# Patient Record
Sex: Female | Born: 1980 | Race: White | Hispanic: No | Marital: Married | Smoking: Current every day smoker
Health system: Southern US, Community
[De-identification: ages and names within clinical notes are randomized; demographics above are authoritative.]

## PROBLEM LIST (undated history)

## (undated) DIAGNOSIS — G8929 Other chronic pain: Secondary | ICD-10-CM

## (undated) DIAGNOSIS — F909 Attention-deficit hyperactivity disorder, unspecified type: Secondary | ICD-10-CM

## (undated) DIAGNOSIS — Z973 Presence of spectacles and contact lenses: Secondary | ICD-10-CM

## (undated) DIAGNOSIS — M7712 Lateral epicondylitis, left elbow: Secondary | ICD-10-CM

## (undated) DIAGNOSIS — F419 Anxiety disorder, unspecified: Secondary | ICD-10-CM

## (undated) DIAGNOSIS — L4050 Arthropathic psoriasis, unspecified: Secondary | ICD-10-CM

## (undated) DIAGNOSIS — R112 Nausea with vomiting, unspecified: Secondary | ICD-10-CM

## (undated) DIAGNOSIS — Z9889 Other specified postprocedural states: Secondary | ICD-10-CM

## (undated) DIAGNOSIS — L0291 Cutaneous abscess, unspecified: Secondary | ICD-10-CM

## (undated) DIAGNOSIS — M549 Dorsalgia, unspecified: Secondary | ICD-10-CM

## (undated) HISTORY — DX: Anxiety disorder, unspecified: F41.9

## (undated) HISTORY — PX: WISDOM TOOTH EXTRACTION: SHX21

## (undated) HISTORY — PX: TONSILLECTOMY AND ADENOIDECTOMY: SHX28

---

## 2000-04-06 ENCOUNTER — Emergency Department (HOSPITAL_COMMUNITY): Admission: EM | Admit: 2000-04-06 | Discharge: 2000-04-06 | Payer: Self-pay | Admitting: *Deleted

## 2000-05-15 ENCOUNTER — Encounter: Payer: Self-pay | Admitting: Emergency Medicine

## 2000-05-15 ENCOUNTER — Emergency Department (HOSPITAL_COMMUNITY): Admission: EM | Admit: 2000-05-15 | Discharge: 2000-05-15 | Payer: Self-pay | Admitting: Emergency Medicine

## 2001-10-01 ENCOUNTER — Inpatient Hospital Stay (HOSPITAL_COMMUNITY): Admission: AD | Admit: 2001-10-01 | Discharge: 2001-10-01 | Payer: Self-pay | Admitting: *Deleted

## 2001-12-29 ENCOUNTER — Emergency Department (HOSPITAL_COMMUNITY): Admission: EM | Admit: 2001-12-29 | Discharge: 2001-12-29 | Payer: Self-pay

## 2002-04-28 ENCOUNTER — Emergency Department (HOSPITAL_COMMUNITY): Admission: EM | Admit: 2002-04-28 | Discharge: 2002-04-28 | Payer: Self-pay | Admitting: *Deleted

## 2002-04-30 ENCOUNTER — Emergency Department (HOSPITAL_COMMUNITY): Admission: EM | Admit: 2002-04-30 | Discharge: 2002-04-30 | Payer: Self-pay | Admitting: Emergency Medicine

## 2002-05-02 ENCOUNTER — Emergency Department (HOSPITAL_COMMUNITY): Admission: EM | Admit: 2002-05-02 | Discharge: 2002-05-02 | Payer: Self-pay | Admitting: Emergency Medicine

## 2002-09-02 ENCOUNTER — Emergency Department (HOSPITAL_COMMUNITY): Admission: EM | Admit: 2002-09-02 | Discharge: 2002-09-02 | Payer: Self-pay | Admitting: Emergency Medicine

## 2002-09-04 ENCOUNTER — Emergency Department (HOSPITAL_COMMUNITY): Admission: EM | Admit: 2002-09-04 | Discharge: 2002-09-04 | Payer: Self-pay | Admitting: Emergency Medicine

## 2002-11-17 ENCOUNTER — Other Ambulatory Visit: Admission: RE | Admit: 2002-11-17 | Discharge: 2002-11-17 | Payer: Self-pay | Admitting: Obstetrics and Gynecology

## 2003-01-30 ENCOUNTER — Emergency Department (HOSPITAL_COMMUNITY): Admission: EM | Admit: 2003-01-30 | Discharge: 2003-01-30 | Payer: Self-pay | Admitting: Emergency Medicine

## 2003-02-28 ENCOUNTER — Other Ambulatory Visit: Admission: RE | Admit: 2003-02-28 | Discharge: 2003-02-28 | Payer: Self-pay | Admitting: Obstetrics and Gynecology

## 2003-06-24 ENCOUNTER — Emergency Department (HOSPITAL_COMMUNITY): Admission: EM | Admit: 2003-06-24 | Discharge: 2003-06-24 | Payer: Self-pay | Admitting: Emergency Medicine

## 2003-07-02 ENCOUNTER — Ambulatory Visit (HOSPITAL_COMMUNITY): Admission: RE | Admit: 2003-07-02 | Discharge: 2003-07-02 | Payer: Self-pay | Admitting: Family Medicine

## 2003-09-18 ENCOUNTER — Other Ambulatory Visit: Admission: RE | Admit: 2003-09-18 | Discharge: 2003-09-18 | Payer: Self-pay | Admitting: Obstetrics and Gynecology

## 2004-01-16 ENCOUNTER — Other Ambulatory Visit: Admission: RE | Admit: 2004-01-16 | Discharge: 2004-01-16 | Payer: Self-pay | Admitting: Obstetrics and Gynecology

## 2006-07-31 ENCOUNTER — Emergency Department (HOSPITAL_COMMUNITY): Admission: EM | Admit: 2006-07-31 | Discharge: 2006-07-31 | Payer: Self-pay | Admitting: Emergency Medicine

## 2006-08-20 ENCOUNTER — Emergency Department (HOSPITAL_COMMUNITY): Admission: EM | Admit: 2006-08-20 | Discharge: 2006-08-20 | Payer: Self-pay | Admitting: Emergency Medicine

## 2006-08-26 ENCOUNTER — Ambulatory Visit: Payer: Self-pay | Admitting: Family Medicine

## 2007-01-29 ENCOUNTER — Observation Stay (HOSPITAL_COMMUNITY): Admission: EM | Admit: 2007-01-29 | Discharge: 2007-01-31 | Payer: Self-pay | Admitting: Emergency Medicine

## 2007-07-18 ENCOUNTER — Emergency Department (HOSPITAL_COMMUNITY): Admission: EM | Admit: 2007-07-18 | Discharge: 2007-07-18 | Payer: Self-pay | Admitting: Emergency Medicine

## 2007-07-25 ENCOUNTER — Emergency Department (HOSPITAL_COMMUNITY): Admission: EM | Admit: 2007-07-25 | Discharge: 2007-07-25 | Payer: Self-pay | Admitting: Emergency Medicine

## 2007-09-08 ENCOUNTER — Emergency Department (HOSPITAL_COMMUNITY): Admission: EM | Admit: 2007-09-08 | Discharge: 2007-09-08 | Payer: Self-pay | Admitting: Emergency Medicine

## 2009-03-03 ENCOUNTER — Emergency Department (HOSPITAL_COMMUNITY): Admission: EM | Admit: 2009-03-03 | Discharge: 2009-03-03 | Payer: Self-pay | Admitting: Emergency Medicine

## 2009-03-05 ENCOUNTER — Emergency Department (HOSPITAL_COMMUNITY): Admission: EM | Admit: 2009-03-05 | Discharge: 2009-03-05 | Payer: Self-pay | Admitting: Emergency Medicine

## 2009-10-12 ENCOUNTER — Emergency Department (HOSPITAL_COMMUNITY): Admission: EM | Admit: 2009-10-12 | Discharge: 2009-10-12 | Payer: Self-pay | Admitting: Family Medicine

## 2009-10-12 ENCOUNTER — Emergency Department (HOSPITAL_COMMUNITY): Admission: EM | Admit: 2009-10-12 | Discharge: 2009-10-12 | Payer: Self-pay | Admitting: Emergency Medicine

## 2010-03-16 DIAGNOSIS — Z8614 Personal history of Methicillin resistant Staphylococcus aureus infection: Secondary | ICD-10-CM

## 2010-03-16 HISTORY — DX: Personal history of Methicillin resistant Staphylococcus aureus infection: Z86.14

## 2010-04-25 ENCOUNTER — Emergency Department (HOSPITAL_COMMUNITY)
Admission: EM | Admit: 2010-04-25 | Discharge: 2010-04-25 | Disposition: A | Discharge status: Home / Self Care | Payer: Self-pay | Attending: Emergency Medicine | Admitting: Emergency Medicine

## 2010-04-25 DIAGNOSIS — F3289 Other specified depressive episodes: Secondary | ICD-10-CM | POA: Insufficient documentation

## 2010-04-25 DIAGNOSIS — L03119 Cellulitis of unspecified part of limb: Secondary | ICD-10-CM | POA: Insufficient documentation

## 2010-04-25 DIAGNOSIS — M79609 Pain in unspecified limb: Secondary | ICD-10-CM | POA: Insufficient documentation

## 2010-04-25 DIAGNOSIS — F329 Major depressive disorder, single episode, unspecified: Secondary | ICD-10-CM | POA: Insufficient documentation

## 2010-04-25 DIAGNOSIS — L02619 Cutaneous abscess of unspecified foot: Secondary | ICD-10-CM | POA: Insufficient documentation

## 2010-04-28 ENCOUNTER — Emergency Department (HOSPITAL_COMMUNITY)
Admission: EM | Admit: 2010-04-28 | Discharge: 2010-04-28 | Disposition: A | Discharge status: Home / Self Care | Payer: Self-pay | Attending: Emergency Medicine | Admitting: Emergency Medicine

## 2010-04-28 ENCOUNTER — Emergency Department (HOSPITAL_COMMUNITY): Payer: Self-pay

## 2010-04-28 DIAGNOSIS — F17200 Nicotine dependence, unspecified, uncomplicated: Secondary | ICD-10-CM | POA: Insufficient documentation

## 2010-04-28 DIAGNOSIS — L03119 Cellulitis of unspecified part of limb: Secondary | ICD-10-CM | POA: Insufficient documentation

## 2010-04-28 DIAGNOSIS — M79609 Pain in unspecified limb: Secondary | ICD-10-CM | POA: Insufficient documentation

## 2010-04-28 DIAGNOSIS — L02619 Cutaneous abscess of unspecified foot: Secondary | ICD-10-CM | POA: Insufficient documentation

## 2010-07-29 NOTE — Discharge Summary (Signed)
NAMESAMAI, COREA                ACCOUNT NO.:  [id number]   MEDICAL RECORD NO.:  [id number]          PATIENT TYPE:  OBV   LOCATION:  5710                         FACILITY:  [facility]   PHYSICIAN:  Michiel Cowboy, MDDATE OF BIRTH:  1981-02-28   DATE OF ADMISSION:  01/29/2007  DATE OF DISCHARGE:  01/31/2007                               DISCHARGE SUMMARY   DISCHARGE DIAGNOSES:  1. Likely viral syndrome.  2. Severe arthralgia, requiring IV pain medication.  3. Dehydration.  4. History of anxiety.   STUDIES:  Chest x-ray done on the 15th of November, 2008, showing no  acute cardiopulmonary disease.  Films of ankle and hand showing no acute  arthritis or changes.  No fractures.   BRIEF HISTORY:  Please see full H&P for complete history.  But shortly,  this is a 30 year old female with past medical history significant for  anxiety.  Patient presented with diffuse arthralgias.   Patient was feeling well until a couple of days prior to admission when  she developed URI type of symptoms with scratchy throat and a slight  cough that proceeded to have generalized muscle aches and joint pain.  This started with digit of her right hand, then progressed to wrists  (right worse than left), knees (right worse than left), hips and left  jaw.  Pain became intolerable and patient was unable to ambulate.  Patient presented to the emergency department, where she was given  Percocet with no improvement in her pain.  She was admitted for pain  management by [facility].   HOSPITAL COURSE:  1. Arthralgias.  Patient has had a very extensive workup for this,      which was basically negative.  This workup included white blood      cell count of 5.7, normal creatinine of 0.6, LFTs within normal      limits, LDH 144, sed rate was slightly elevated at 44.  Her ANA was      negative.  Rheumatoid factor was negative.  Her TSH was within      normal limits.  CTA was within normal limits.  B12,  folate were all      within normal limits.  HIV was negative.  Mono spot negative.      Influenza negative.  UA clean.  Chest x-ray had no infiltrate.      Gonorrhea PCR was negative.  Patient initially had a slightly      elevated glucose at 153, but this was after receiving a shot of IM      steroids while in the emergency department.  Her hemoglobin      initially was low, 5.6, and her glucose had normalized.  Patient      was at first unable to tolerate only p.o. pain medication;      requested IV morphine at night.  Patient did ambulate to go outside      smoking, but other than that thought that she had severe pain.  On      the day of discharge, patient is feeling well on tramadol and  ibuprofen, but does state that her throat still feels somewhat      scratchy although her arthralgias have improved.  Will discharge      patient on tramadol 25 mg p.o. q.6h. p.r.n. and ibuprofen.  Will      also give patient a prescription for Z-Pak, to be taken only if her      symptoms worsen but not to be taken otherwise.  Patient to have      very close followup with Dr. Azucena Cecil, and I told her to schedule an      appointment prior to leaving the hospital.  2. Hypokalemia.  Prior to discharge, it was noted that patient's      potassium slightly was down to 3.4.  Prior to discharge, I ordered      4 mEq of K-Dur.  This can be rechecked when she presents to her      primary care [facility].  3. Anxiety.  Continue the Wellbutrin.  4. Itching.  It was noted that patient developed itching after      Percocet.  She has been taking Zyrtec in the past, which we      restarted.  We stopped her Percocet instead to the ultram though      itch has resolved.   DISCHARGE MEDICATIONS:  1. Z-Pak.  2. Wellbutrin 150 mg p.o. b.i.d.  3. Zyrtec 10 mg p.o. daily.  4. Tramadol 25 mg p.o. q.6h. p.r.n. pain #20 tabs given.  5. Ibuprofen 600 p.o. q.8h. p.r.n. pain.  Patient not to take it for      longer than 1  week.   FOLLOWUP:  Follow up with Dr. Azucena Cecil.  Patient also to return and seek  medical attention if she develops chest pain, shortness of breath,  fevers, chills or severe pain.      Michiel Cowboy, MD  Electronically Signed     AVD/MEDQ  D:  01/31/2007  T:  01/31/2007  Job:  [id number]   cc:   Tally Joe, M.D.

## 2010-07-29 NOTE — H&P (Signed)
Breanna Garcia, Breanna Garcia                ACCOUNT NO.:  [id number]   MEDICAL RECORD NO.:  [id number]          PATIENT TYPE:  INP   LOCATION:  5710                         FACILITY:  [facility]   PHYSICIAN:  Michiel Cowboy, MDDATE OF BIRTH:  29-Mar-1980   DATE OF ADMISSION:  01/29/2007  DATE OF DISCHARGE:                              HISTORY & PHYSICAL   PRIMARY CARE PHYSICIAN:  Dr. Azucena Cecil with [facility] [place]   CHIEF COMPLAINT:  Pain in joints.   PRESENT ILLNESS:  This is a 30 year old female with past medical history  significant for anxiety only .  Patient initially started to have some  URI type of symptoms about five days ago with cough, sore throat which  proceeded to generalize muscle ache as well as joint pain.  The joints  particularly started with swollen proximal phalangeus of right hand,  second finger and then proceeded to diffuse pain on wrists, right worse  than left, shoulder, right worse than left, hips bilaterally, ankles  bilaterally, knees, right worse than left as well.  Patient  progressively stated that she became immobile secondary to severe pain,  unable to move her hands or legs stating that it hurts too much.  At  this point, her cough has improved, but her pain became very severe and  patient decided to come to emergency department.  In emergency  department, films of all four extremities were obtained showing no  arthritic changes.  She was given Dilaudid 2 mg, dexamethasone 10 mg IM  and Percocet.  Her pain responded to Dilaudid.  Of note, past medical  history only significant for anxiety.   FAMILY:  There is no history of arthritides, but there is history of  colon cancer, hypertension, diabetes in her family.   SOCIAL HISTORY:  Patient is a current smoker, but denies alcohol or drug  abuse, lives at home by herself, has a dog, but states she has no  exposure to woods and never traveled outside of [place].   DRUG ALLERGIES:  PATIENT  STATES THAT VICODIN AND PENICILLIN GIVE A RASH.   MEDICATIONS:  Wellbutrin 150 mg p.o. twice a day.   REVIEW OF SYSTEMS:  Negative for fevers, positive for chills, no chest  pain, no shortness of breath, positive for cough and URI-type symptoms,  no urinary dysfunction.   PHYSICAL EXAMINATION:  VITAL SIGNS:  Temperature 99.1, pulse 100 to 147,  respirations 20, blood pressure 139/99, sating 99% room air.  GENERAL:  Patient appears to be in pain, but somewhat groggy after pain  medication.  HEENT:  Nontraumatic.  No lymphadenopathy noted.  NECK:  Supple.  HEART:  Regular rate and rhythm.  No murmurs, rubs or gallops.  LUNGS:  Minimal crackles at the bases.  ABDOMEN:  Nontender, nondistended.  EXTREMITIES:  Significant for very slight effusion on the right knee and  slight bogginess over proximal joints of phalangeus, no swelling or  redness over joint diffusely noted.  NEUROLOGIC:  Nonfocal.   LABS:  White blood cell count 9.9, H&H 13.1/38.7, platelets 423.  Chemistries:  Sodium 135, potassium 3.8,  creatinine 0.7, albumin 3.6.  LFTs within normal limits.   EKG within normal limits.   X-rays of ankles and wrists appear to be generally without any acute  findings.   ASSESSMENT:  This is a 30 year old female with acute joint pain diffuse  of unclear etiology.  Differential diagnosis is broad, but includes part  of a viral illness.  Sudden onset of the symptoms is not consistent with  chronic arthritides such as lupus or rheumatoid arthritis.  Other  possibilities include a bacterial arthritis such as caused by gonorrhea,  but less likely given diffuse joint involvement.   PLAN:  1. Arthritides.  Will obtain some screening labs including sed rate,      ANA, rheumatoid factor, TSH, folate, B12, CK and LDH.  Will also      check for HIV status.  Will obtain urine gonorrhea PCR.  Will treat      patient's pain with Percocet, as she tolerated this medication      today, as well as  with morphine as needed for breakthrough pain.  2. Prophylaxis.  Will put on Protonix and Lovenox for prophylaxis.  3. Dehydration.  Will re-hydrate patient with some normal saline      overnight.  If no rapid improvement noted, will consult      rheumatology for help with further diagnostic studies.  Otherwise,      expect discharge to home with close followup.  4. Given upper respiratory infection symptoms, will obtain influenza      swab and monospot.      Michiel Cowboy, MD  Electronically Signed     AVD/MEDQ  D:  01/29/2007  T:  01/29/2007  Job:  [id number]

## 2010-08-06 ENCOUNTER — Inpatient Hospital Stay (HOSPITAL_COMMUNITY)
Admission: RE | Admit: 2010-08-06 | Discharge: 2010-08-06 | Disposition: A | Discharge status: Home / Self Care | Payer: Self-pay | Source: Ambulatory Visit

## 2010-12-23 LAB — CK: Total CK: 37

## 2010-12-23 LAB — BASIC METABOLIC PANEL
Calcium: 8.4
Chloride: 107
Creatinine, Ser: 0.58
GFR calc Af Amer: >60
Sodium: 137

## 2010-12-23 LAB — URINALYSIS, ROUTINE W REFLEX MICROSCOPIC
Nitrite: NEGATIVE
Specific Gravity, Urine: 1.008
Urobilinogen, UA: 1

## 2010-12-23 LAB — TSH: TSH: 0.429

## 2010-12-23 LAB — PROTEIN / CREATININE RATIO, URINE: Creatinine, Urine: 25.1

## 2010-12-23 LAB — CBC
HCT: 36.5
Hemoglobin: 11.6 — ABNORMAL LOW
Hemoglobin: 12.4
Hemoglobin: 13.1
MCHC: 33.8
MCV: 93.3
MCV: 94.3
Platelets: 423 — ABNORMAL HIGH
RBC: 3.67 — ABNORMAL LOW
RBC: 3.92
RDW: 12.6
WBC: 10.4
WBC: 5.7

## 2010-12-23 LAB — COMPREHENSIVE METABOLIC PANEL
ALT: 13
ALT: 15
Alkaline Phosphatase: 64
Alkaline Phosphatase: 65
BUN: 3 — ABNORMAL LOW
BUN: 5 — ABNORMAL LOW
CO2: 25
Chloride: 105
Creatinine, Ser: 0.65
GFR calc Af Amer: >60
GFR calc non Af Amer: >60
Glucose, Bld: 115 — ABNORMAL HIGH
Glucose, Bld: 153 — ABNORMAL HIGH
Potassium: 3.8
Potassium: 3.8
Sodium: 136
Total Bilirubin: 0.5
Total Protein: 6

## 2010-12-23 LAB — DIFFERENTIAL
Basophils Absolute: 0
Eosinophils Absolute: 0.1 — ABNORMAL LOW
Lymphocytes Relative: 10 — ABNORMAL LOW
Lymphs Abs: 1
Monocytes Absolute: 0.6
Monocytes Relative: 6
Neutro Abs: 8.2 — ABNORMAL HIGH

## 2010-12-23 LAB — RAPID URINE DRUG SCREEN, HOSP PERFORMED
Amphetamines: NOT DETECTED
Barbiturates: NOT DETECTED
Benzodiazepines: NOT DETECTED
Opiates: NOT DETECTED
Tetrahydrocannabinol: NOT DETECTED

## 2010-12-23 LAB — RHEUMATOID FACTOR: Rhuematoid fact SerPl-aCnc: <20

## 2010-12-23 LAB — INFLUENZA A+B VIRUS AG-DIRECT(RAPID): Influenza B Ag: NEGATIVE

## 2010-12-23 LAB — GC/CHLAMYDIA PROBE AMP, URINE: Chlamydia, Swab/Urine, PCR: NEGATIVE

## 2010-12-23 LAB — URINE MICROSCOPIC-ADD ON

## 2010-12-23 LAB — APTT: aPTT: 28

## 2010-12-23 LAB — ANTI-NEUTROPHIL ANTIBODY: Cytoplasmic Neutrophilic Ab: <1:20 {titer}

## 2010-12-23 LAB — MONONUCLEOSIS SCREEN: Mono Screen: NEGATIVE

## 2010-12-23 LAB — HEMOGLOBIN A1C: Hgb A1c MFr Bld: 5.6

## 2010-12-23 LAB — SEDIMENTATION RATE: Sed Rate: 44 — ABNORMAL HIGH

## 2010-12-23 LAB — FOLATE: Folate: >20

## 2012-04-17 ENCOUNTER — Encounter (HOSPITAL_COMMUNITY): Payer: Self-pay | Admitting: Emergency Medicine

## 2012-04-17 ENCOUNTER — Emergency Department (HOSPITAL_COMMUNITY)
Admission: EM | Admit: 2012-04-17 | Discharge: 2012-04-17 | Disposition: A | Discharge status: Home / Self Care | Payer: Self-pay | Attending: Emergency Medicine | Admitting: Emergency Medicine

## 2012-04-17 DIAGNOSIS — M545 Low back pain, unspecified: Secondary | ICD-10-CM | POA: Insufficient documentation

## 2012-04-17 DIAGNOSIS — R209 Unspecified disturbances of skin sensation: Secondary | ICD-10-CM | POA: Insufficient documentation

## 2012-04-17 DIAGNOSIS — F17200 Nicotine dependence, unspecified, uncomplicated: Secondary | ICD-10-CM | POA: Insufficient documentation

## 2012-04-17 DIAGNOSIS — G8929 Other chronic pain: Secondary | ICD-10-CM | POA: Insufficient documentation

## 2012-04-17 HISTORY — DX: Dorsalgia, unspecified: M54.9

## 2012-04-17 HISTORY — DX: Other chronic pain: G89.29

## 2012-04-17 MED ORDER — PREDNISONE 20 MG PO TABS: ORAL_TABLET | ORAL | Status: DC

## 2012-04-17 MED ORDER — CYCLOBENZAPRINE HCL 10 MG PO TABS: 10.0000 mg | ORAL_TABLET | Freq: Two times a day (BID) | ORAL | Status: DC | PRN

## 2012-04-17 MED ORDER — CYCLOBENZAPRINE HCL 10 MG PO TABS
10.0000 mg | ORAL_TABLET | Freq: Once | ORAL | Status: AC
Start: 2012-04-17 — End: 2012-04-17
  Administered 2012-04-17: 10 mg via ORAL
  Filled 2012-04-17: qty 1

## 2012-04-17 NOTE — ED Provider Notes (Signed)
History   This chart was scribed for non-physician practitioner working with Breanna Garcia. Breanna Payor, MD by Breanna Garcia, ED Scribe. This patient was seen in room WTR5/WTR5 and the patient's care was started at 1950.   CSN: [id number]  Arrival date & time 04/17/12  1950   First MD Initiated Contact with Patient 04/17/12 2001      Chief Complaint  Patient presents with  . Back Pain     The history is provided by the patient. No language interpreter was used.    Breanna Garcia is a 32 y.o. female with a h/o chronic back pain who presents to the Emergency Department complaining of chronic, constant, unchanged lower left back pain starting a couple of weeks ago. Pt reports the pain to be 10/10. Pt states nothing specific happens that triggers the pain. No new injury or trauma. She states the pain recurs regularly. Pt states she has taken Advil with no relief. Pt does not attend a pain clinic. Pain is worse when sitting down. She has associated intermittent numbness and tingling in the legs when back pain is worse. She denies loss of bowel or bladder function. No saddle anesthesia.   Pt is a current everyday smoker and occasional alcohol user.  Past Medical History  Diagnosis Date  . Chronic back pain     History reviewed. No pertinent past surgical history.  No family history on file.  History  Substance Use Topics  . Smoking status: Current Every Day Smoker  . Smokeless tobacco: Not on file  . Alcohol Use: Yes    No OB history provided.   Review of Systems A complete 10 system review of systems was obtained and all systems are negative except as noted in the HPI and PMH.    Allergies  Penicillins  Home Medications  No current outpatient prescriptions on file.  BP 116/74  Pulse 87  Temp 98.4 F (36.9 C)  Resp 16  SpO2 100%  LMP 04/14/2012  Physical Exam  Nursing note and vitals reviewed. Constitutional: She is oriented to person, place, and time. She appears  well-developed and well-nourished. No distress.  HENT:  Head: Normocephalic and atraumatic.  Eyes: Conjunctivae normal and EOM are normal.  Neck: Normal range of motion. Neck supple. No tracheal deviation present.  Cardiovascular: Normal rate, regular rhythm and normal heart sounds.   Pulmonary/Chest: Effort normal and breath sounds normal. No respiratory distress. She has no wheezes. She has no rales.  Abdominal: Soft. Bowel sounds are normal. There is no tenderness.  Musculoskeletal: Normal range of motion.       Lumbar back: She exhibits normal range of motion and normal pulse.       No tenderness in the lumbar spine or paraspinal muscles.    Neurological: She is alert and oriented to person, place, and time. She has normal strength. No sensory deficit.       Strength is equal bilaterally. Sensation intact. Normal gait.   Skin: Skin is warm and dry. No rash noted.  Psychiatric: She has a normal mood and affect. Her behavior is normal.    ED Course  Procedures (including critical care time)  DIAGNOSTIC STUDIES: Oxygen Saturation is 100% on room air, normal by my interpretation.    COORDINATION OF CARE:  8:09 PM Discussed treatment plan which includes Flexeril and prednisone with pt at bedside and pt agreed to plan.    Labs Reviewed - No data to display No results found.   1. Chronic back  pain       MDM  32 y/o female with exacerbation of chronic low back pain. No red flags concerning patient's back pain. No neuro deficits. No signs of cauda equina. ambulating without difficulty. Rx flexeril and prednisone. Advised her to f/u with her PCP. Return precautions discussed. Patient states understanding of plan and is agreeable.       I personally performed the services described in this documentation, which was scribed in my presence. The recorded information has been reviewed and is accurate.   Breanna Mace, PA-C 04/17/12 2026

## 2012-04-17 NOTE — ED Notes (Addendum)
Pt presents to ED with c/o L chronic back pain, reports pain 10/10.  Pt stated "my back has been messed up for a very long time, the pain has worsened over the past couple of weeks.  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

## 2012-04-17 NOTE — ED Provider Notes (Signed)
Medical screening examination/treatment/procedure(s) were performed by non-physician practitioner and as supervising physician I was immediately available for consultation/collaboration.  [name]. [name], MD 04/17/12 [phone]

## 2012-05-15 ENCOUNTER — Encounter (HOSPITAL_COMMUNITY): Payer: Self-pay | Admitting: Emergency Medicine

## 2012-05-15 ENCOUNTER — Emergency Department (HOSPITAL_COMMUNITY)
Admission: EM | Admit: 2012-05-15 | Discharge: 2012-05-15 | Disposition: A | Discharge status: Home / Self Care | Payer: Self-pay | Attending: Emergency Medicine | Admitting: Emergency Medicine

## 2012-05-15 DIAGNOSIS — Z87828 Personal history of other (healed) physical injury and trauma: Secondary | ICD-10-CM | POA: Insufficient documentation

## 2012-05-15 DIAGNOSIS — M549 Dorsalgia, unspecified: Secondary | ICD-10-CM

## 2012-05-15 DIAGNOSIS — M545 Low back pain, unspecified: Secondary | ICD-10-CM | POA: Insufficient documentation

## 2012-05-15 DIAGNOSIS — F17200 Nicotine dependence, unspecified, uncomplicated: Secondary | ICD-10-CM | POA: Insufficient documentation

## 2012-05-15 DIAGNOSIS — G8929 Other chronic pain: Secondary | ICD-10-CM | POA: Insufficient documentation

## 2012-05-15 MED ORDER — BACLOFEN 20 MG PO TABS
20.0000 mg | ORAL_TABLET | Freq: Once | ORAL | Status: AC
  Administered 2012-05-15: 20 mg via ORAL
  Filled 2012-05-15: qty 1

## 2012-05-15 MED ORDER — BACLOFEN 20 MG PO TABS: 20.0000 mg | ORAL_TABLET | Freq: Three times a day (TID) | ORAL | Status: DC

## 2012-05-15 MED ORDER — DICLOFENAC SODIUM 75 MG PO TBEC: 75.0000 mg | DELAYED_RELEASE_TABLET | Freq: Two times a day (BID) | ORAL | Status: DC

## 2012-05-15 MED ORDER — HYDROCODONE-ACETAMINOPHEN 5-325 MG PO TABS: 1.0000 | ORAL_TABLET | ORAL | Status: DC | PRN
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
## 2012-05-15 NOTE — ED Notes (Signed)
Pt states she was seen before for LL back pain. States it goes down through her hip into her leg. Said muscle relaxers didn't help.

## 2012-05-17 NOTE — ED Provider Notes (Signed)
Medical screening examination/treatment/procedure(s) were performed by non-physician practitioner and as supervising physician I was immediately available for consultation/collaboration.   Kevin E Steinl, MD 05/17/12 0725 

## 2012-10-01 ENCOUNTER — Encounter (HOSPITAL_COMMUNITY): Payer: Self-pay

## 2012-10-01 ENCOUNTER — Emergency Department (HOSPITAL_COMMUNITY)
Admission: EM | Admit: 2012-10-01 | Discharge: 2012-10-01 | Disposition: A | Discharge status: Home / Self Care | Payer: Self-pay | Attending: Emergency Medicine | Admitting: Emergency Medicine

## 2012-10-01 DIAGNOSIS — F17200 Nicotine dependence, unspecified, uncomplicated: Secondary | ICD-10-CM | POA: Insufficient documentation

## 2012-10-01 DIAGNOSIS — Z880 Allergy status to penicillin: Secondary | ICD-10-CM | POA: Insufficient documentation

## 2012-10-01 DIAGNOSIS — L0231 Cutaneous abscess of buttock: Secondary | ICD-10-CM | POA: Insufficient documentation

## 2012-10-01 DIAGNOSIS — G8929 Other chronic pain: Secondary | ICD-10-CM | POA: Insufficient documentation

## 2012-10-01 DIAGNOSIS — L0291 Cutaneous abscess, unspecified: Secondary | ICD-10-CM

## 2012-10-01 MED ORDER — SULFAMETHOXAZOLE-TRIMETHOPRIM 800-160 MG PO TABS: 1.0000 | ORAL_TABLET | Freq: Two times a day (BID) | ORAL | Status: DC

## 2012-10-01 MED ORDER — HYDROCODONE-ACETAMINOPHEN 5-325 MG PO TABS: 2.0000 | ORAL_TABLET | Freq: Four times a day (QID) | ORAL | Status: DC | PRN

## 2012-10-01 NOTE — ED Notes (Signed)
[id number]> Expected date:<BR> Expected time:<BR> Means of arrival:<BR> Comments:<BR> For cleaning

## 2012-10-01 NOTE — ED Provider Notes (Signed)
Medical screening examination/treatment/procedure(s) were performed by non-physician practitioner and as supervising physician I was immediately available for consultation/collaboration.  Billi Bright, MD 10/01/12 2333 

## 2012-10-01 NOTE — ED Provider Notes (Signed)
History    This chart was scribed for non-physician practitioner Roxy Horseman, PA working with Doug Sou, MD by Quintella Reichert, ED Scribe. This patient was seen in room WTR6/WTR6 and the patient's care was started at 7:15 PM.   CSN: [id number]  Arrival date & time 10/01/12  1815    Chief Complaint  Patient presents with  . Abscess    left buttock    The history is provided by the patient. No language interpreter was used.    HPI Comments: Breanna Garcia is a 32 y.o. female who presents to the Emergency Department with a chief complaint of an abscess to the left buttock that began 6 days ago and has been progressively worsening.  The abscess is not aggravated or relieved by anything.  Pt has not tried anything to alleviate her symptoms.  She notes that she has has h/o abscesses which have required draining in the past.  She denies fever, chills, nausea or vomiting.   Past Medical History  Diagnosis Date  . Chronic back pain     History reviewed. No pertinent past surgical history.   History reviewed. No pertinent family history.   History  Substance Use Topics  . Smoking status: Current Every Day Smoker  . Smokeless tobacco: Not on file  . Alcohol Use: Yes    OB History   Grav Para Term Preterm Abortions TAB SAB Ect Mult Living                  Review of Systems A complete 10 system review of systems was obtained and all systems are negative except as noted in the HPI and PMH.     Allergies  Penicillins  Home Medications   Current Outpatient Rx  Name  Route  Sig  Dispense  Refill  . baclofen (LIORESAL) 20 MG tablet   Oral   Take 1 tablet (20 mg total) by mouth 3 (three) times daily.   30 each   0   . HYDROcodone-acetaminophen (NORCO/VICODIN) 5-325 MG per tablet   Oral   Take 1 tablet by mouth every 4 (four) hours as needed for pain.   6 tablet   0   . ibuprofen (ADVIL,MOTRIN) 200 MG tablet   Oral   Take 600 mg by mouth every 6 (six)  hours as needed for pain.          LMP 09/17/2012  Physical Exam  Nursing note and vitals reviewed. Constitutional: She is oriented to person, place, and time. She appears well-developed and well-nourished. No distress.  HENT:  Head: Normocephalic and atraumatic.  Eyes: EOM are normal.  Neck: Neck supple. No tracheal deviation present.  Cardiovascular: Normal rate.   Pulmonary/Chest: Effort normal. No respiratory distress.  Musculoskeletal: Normal range of motion.  Neurological: She is alert and oriented to person, place, and time.  Skin: Skin is warm and dry.  2x2-cm abscess to the left buttock, with moderate induration and mild discharge.  Very tender to palpation.  Psychiatric: She has a normal mood and affect. Her behavior is normal.    ED Course  Procedures (including critical care time)   COORDINATION OF CARE: 7:17 PM-Discussed treatment plan which includes incision and drainage with pt at bedside and pt agreed to plan.    INCISION AND DRAINAGE PROCEDURE NOTE: Patient identification was confirmed and verbal consent was obtained. This procedure was performed by Roxy Horseman, PA at 7:25 PM. Site: Left buttock Sterile procedures observed Anesthetic used (type and amt):  2% lidocaine w/o epinephrine, 5 mL Incision made with scalpel Drainage: Moderate Complexity: Complex Site anesthetized, incision made over site, wound drained and explored loculations, covered with dry, sterile dressing.  Pt tolerated procedure well without complications.  Instructions for care discussed verbally and pt provided with additional written instructions for homecare and f/u.    Labs Reviewed - No data to display  No results found.  1. Abscess     MDM  Patient with abscess. This was drained successfully in the emergency department. Discharged home with antibiotics, and surgical followup at the abscess returns. Patient understands and agrees with the plan. She is stable and ready  for discharge    I personally performed the services described in this documentation, which was scribed in my presence. The recorded information has been reviewed and is accurate.     Roxy Horseman, PA-C 10/01/12 2200

## 2012-10-01 NOTE — ED Notes (Signed)
She c/o left buttock abscess x ~2 days.  She is in no distress.  She states she has had abscesses I & D'd. In the past.

## 2012-12-02 ENCOUNTER — Emergency Department (HOSPITAL_COMMUNITY)
Admission: EM | Admit: 2012-12-02 | Discharge: 2012-12-03 | Disposition: A | Discharge status: Home / Self Care | Payer: Self-pay | Attending: Emergency Medicine | Admitting: Emergency Medicine

## 2012-12-02 ENCOUNTER — Encounter (HOSPITAL_COMMUNITY): Payer: Self-pay | Admitting: Emergency Medicine

## 2012-12-02 DIAGNOSIS — F17200 Nicotine dependence, unspecified, uncomplicated: Secondary | ICD-10-CM | POA: Insufficient documentation

## 2012-12-02 DIAGNOSIS — Z79899 Other long term (current) drug therapy: Secondary | ICD-10-CM | POA: Insufficient documentation

## 2012-12-02 DIAGNOSIS — Z880 Allergy status to penicillin: Secondary | ICD-10-CM | POA: Insufficient documentation

## 2012-12-02 DIAGNOSIS — L03211 Cellulitis of face: Secondary | ICD-10-CM | POA: Insufficient documentation

## 2012-12-02 DIAGNOSIS — G8929 Other chronic pain: Secondary | ICD-10-CM | POA: Insufficient documentation

## 2012-12-02 DIAGNOSIS — L0201 Cutaneous abscess of face: Secondary | ICD-10-CM | POA: Insufficient documentation

## 2012-12-02 HISTORY — DX: Cutaneous abscess, unspecified: L02.91

## 2012-12-02 NOTE — ED Notes (Signed)
Pt states that she went to the doctor on Wednesday and was told it was an abscess. Pt states it has gotten larger and is not fraining. Abscess is located on the left chin area.

## 2012-12-03 MED ORDER — HYDROCODONE-ACETAMINOPHEN 5-325 MG PO TABS: 2.0000 | ORAL_TABLET | ORAL | Status: DC | PRN

## 2012-12-03 MED ORDER — SULFAMETHOXAZOLE-TRIMETHOPRIM 800-160 MG PO TABS: 1.0000 | ORAL_TABLET | Freq: Two times a day (BID) | ORAL | Status: DC

## 2012-12-03 NOTE — ED Provider Notes (Signed)
Medical screening examination/treatment/procedure(s) were performed by non-physician practitioner and as supervising physician I was immediately available for consultation/collaboration.  Maevyn Riordan M Dashiell Franchino, MD 12/03/12 0217 

## 2012-12-03 NOTE — ED Notes (Signed)
Wound care provided prior to d/c

## 2012-12-03 NOTE — ED Provider Notes (Signed)
CSN: [id number]     Arrival date & time 12/02/12  2314 History   First MD Initiated Contact with Patient 12/02/12 2341     Chief Complaint  Patient presents with  . Abscess   (Consider location/radiation/quality/duration/timing/severity/associated sxs/prior Treatment) HPI Comments: Patient is a 32 year old female with a past medical history of abscesses who presents with an abscess on her face for the past week. Symptoms started gradually and progressively worsened since the onset. Patient reports associated throbbing and severe pain without radiation. Patient reports the area has been draining pus. Patient went to her PCP 2 days ago and was told "it is draining like it's supposed to." Patient reports worsening pain. Palpation makes the pain worse. Nothing makes the pain better.    Past Medical History  Diagnosis Date  . Chronic back pain   . Abscess    History reviewed. No pertinent past surgical history. No family history on file. History  Substance Use Topics  . Smoking status: Current Every Day Smoker -- 1.00 packs/day    Types: Cigarettes  . Smokeless tobacco: Never Used  . Alcohol Use: Yes     Comment: occasionally   OB History   Grav Para Term Preterm Abortions TAB SAB Ect Mult Living                 Review of Systems  Skin: Positive for wound.  All other systems reviewed and are negative.    Allergies  Penicillins  Home Medications   Current Outpatient Rx  Name  Route  Sig  Dispense  Refill  . baclofen (LIORESAL) 20 MG tablet   Oral   Take 1 tablet (20 mg total) by mouth 3 (three) times daily.   30 each   0   . HYDROcodone-acetaminophen (NORCO/VICODIN) 5-325 MG per tablet   Oral   Take 2 tablets by mouth every 6 (six) hours as needed for pain.   15 tablet   0   . ibuprofen (ADVIL,MOTRIN) 200 MG tablet   Oral   Take 600 mg by mouth every 6 (six) hours as needed for pain.         Marland Kitchen sulfamethoxazole-trimethoprim (SEPTRA DS) 800-160 MG per tablet  Oral   Take 1 tablet by mouth every 12 (twelve) hours.   20 tablet   0    BP 121/72  Pulse 71  Temp(Src) 98.1 F (36.7 C) (Oral)  Resp 18  Ht 5\' 2"  (1.575 m)  Wt 178 lb (80.74 kg)  BMI 32.55 kg/m2  SpO2 100%  LMP 11/10/2012 Physical Exam  Nursing note and vitals reviewed. Constitutional: She is oriented to person, place, and time. She appears well-developed and well-nourished. No distress.  HENT:  Head: Normocephalic and atraumatic.  Eyes: Conjunctivae and EOM are normal. Pupils are equal, round, and reactive to light.  Neck: Normal range of motion.  Cardiovascular: Normal rate and regular rhythm.  Exam reveals no gallop and no friction rub.   No murmur heard. Pulmonary/Chest: Effort normal and breath sounds normal. She has no wheezes. She has no rales. She exhibits no tenderness.  Abdominal: Soft. There is no tenderness.  Musculoskeletal: Normal range of motion.  Lymphadenopathy:    She has no cervical adenopathy.  Neurological: She is alert and oriented to person, place, and time. Coordination normal.  Speech is goal-oriented. Moves limbs without ataxia.   Skin: Skin is warm and dry.  Open abscess of left mandible with purulent drainage at the opening. Surrounding erythema and edema  noted without fluctuance.   Psychiatric: She has a normal mood and affect. Her behavior is normal.    ED Course  Procedures (including critical care time) Labs Review Labs Reviewed - No data to display Imaging Review No results found.  MDM   1. Cellulitis and abscess of face     12:20 AM I attempted to produce some drainage from the site with minimal success. Patient will have Bactrim and Vicodin and instructions to follow up with a Dermatologist. Patient instructed to apply warm compresses at home. Vitals stable and patient afebrile. No further evaluation needed at this time.     Emilia Beck, [place] 12/03/12 [phone]

## 2012-12-08 ENCOUNTER — Ambulatory Visit: Payer: Self-pay

## 2013-01-06 ENCOUNTER — Emergency Department (HOSPITAL_COMMUNITY)
Admission: EM | Admit: 2013-01-06 | Discharge: 2013-01-06 | Disposition: A | Discharge status: Home / Self Care | Payer: Self-pay | Attending: Emergency Medicine | Admitting: Emergency Medicine

## 2013-01-06 ENCOUNTER — Encounter (HOSPITAL_COMMUNITY): Payer: Self-pay | Admitting: Emergency Medicine

## 2013-01-06 DIAGNOSIS — F17200 Nicotine dependence, unspecified, uncomplicated: Secondary | ICD-10-CM | POA: Insufficient documentation

## 2013-01-06 DIAGNOSIS — G8929 Other chronic pain: Secondary | ICD-10-CM | POA: Insufficient documentation

## 2013-01-06 DIAGNOSIS — L02419 Cutaneous abscess of limb, unspecified: Secondary | ICD-10-CM | POA: Insufficient documentation

## 2013-01-06 DIAGNOSIS — L0390 Cellulitis, unspecified: Secondary | ICD-10-CM

## 2013-01-06 DIAGNOSIS — Z880 Allergy status to penicillin: Secondary | ICD-10-CM | POA: Insufficient documentation

## 2013-01-06 DIAGNOSIS — Z79899 Other long term (current) drug therapy: Secondary | ICD-10-CM | POA: Insufficient documentation

## 2013-01-06 MED ORDER — SULFAMETHOXAZOLE-TMP DS 800-160 MG PO TABS
1.0000 | ORAL_TABLET | Freq: Once | ORAL | Status: AC
  Administered 2013-01-06: 1 via ORAL
  Filled 2013-01-06: qty 1

## 2013-01-06 MED ORDER — TRAMADOL HCL 50 MG PO TABS: 50.0000 mg | ORAL_TABLET | Freq: Four times a day (QID) | ORAL | Status: DC | PRN

## 2013-01-06 MED ORDER — SULFAMETHOXAZOLE-TMP DS 800-160 MG PO TABS: 1.0000 | ORAL_TABLET | Freq: Two times a day (BID) | ORAL | Status: DC

## 2013-01-06 NOTE — ED Notes (Signed)
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

## 2013-01-06 NOTE — ED Provider Notes (Signed)
CSN: [id number]     Arrival date & time 01/06/13  1908 History   First MD Initiated Contact with Patient 01/06/13 2004     Chief Complaint  Patient presents with  . Abscess   (Consider location/radiation/quality/duration/timing/severity/associated sxs/prior Treatment) HPI Comments: Patient states this is the fourth "abscess" that she's had it for months.  They seem to be around the time of her menstrual cycle, so she thinks that they're hormonally mediated this particular abscess on the medial aspect of her left knee, it is red, slightly swollen.  No exudate.  Not fluctuant or indurated painful to touch.  Patient denies fever, or nausea or tachycardia, myalgias, or fatigue.  She has not seen her primary care physician, which is placed in at urgent care.  Patient is a 32 y.o. female presenting with abscess. The history is provided by the patient.  Abscess Location:  Leg Leg abscess location:  L knee Abscess quality: painful and warmth   Abscess quality: not draining, no fluctuance, no induration and no itching   Red streaking: yes   Duration:  3 days Progression:  Worsening Pain details:    Quality:  Aching   Severity:  Mild   Duration:  3 days   Timing:  Constant   Progression:  Worsening Chronicity:  Recurrent Context: not diabetes, not immunosuppression, not insect bite/sting and not skin injury   Relieved by:  Nothing Worsened by:  Nothing tried Ineffective treatments:  Warm compresses Associated symptoms: no fatigue, no fever and no nausea   Risk factors: prior abscess     Past Medical History  Diagnosis Date  . Chronic back pain   . Abscess    History reviewed. No pertinent past surgical history. No family history on file. History  Substance Use Topics  . Smoking status: Current Every Day Smoker -- 1.00 packs/day    Types: Cigarettes  . Smokeless tobacco: Never Used  . Alcohol Use: Yes     Comment: occasionally   OB History   Grav Para Term Preterm Abortions  TAB SAB Ect Mult Living                 Review of Systems  Constitutional: Negative for fever and fatigue.  Cardiovascular: Negative for leg swelling.  Gastrointestinal: Negative for nausea.  Skin: Positive for wound.  All other systems reviewed and are negative.    Allergies  Penicillins  Home Medications   Current Outpatient Rx  Name  Route  Sig  Dispense  Refill  . baclofen (LIORESAL) 20 MG tablet   Oral   Take 1 tablet (20 mg total) by mouth 3 (three) times daily.   30 each   0   . ibuprofen (ADVIL,MOTRIN) 200 MG tablet   Oral   Take 400 mg by mouth every 6 (six) hours as needed for pain (pain).          . Multiple Vitamins-Minerals (MULTIVITAMIN WITH MINERALS) tablet   Oral   Take 1 tablet by mouth daily.         Marland Kitchen sulfamethoxazole-trimethoprim (BACTRIM DS) 800-160 MG per tablet   Oral   Take 1 tablet by mouth 2 (two) times daily.   13 tablet   0   . traMADol (ULTRAM) 50 MG tablet   Oral   Take 1 tablet (50 mg total) by mouth every 6 (six) hours as needed for pain.   15 tablet   0    BP 125/74  Pulse 74  Temp(Src) 97.3 F (36.3  C) (Oral)  Resp 16  Ht 5\' 2"  (1.575 m)  Wt 165 lb (74.844 kg)  BMI 30.17 kg/m2  SpO2 100%  LMP 12/19/2012 Physical Exam  Nursing note and vitals reviewed. Constitutional: She appears well-developed and well-nourished.  HENT:  Head: Normocephalic.  Eyes: Pupils are equal, round, and reactive to light.  Neck: Normal range of motion.  Cardiovascular: Normal rate and regular rhythm.   Pulmonary/Chest: Effort normal.  Musculoskeletal: She exhibits tenderness. She exhibits no edema.  Neurological: She is alert.  Skin: Skin is warm and dry. There is erythema.     Small scab surrounded by area of erythma and tenderness no insurance.  Patient is able to flex her knee to 90 she is ambulatory    ED Course  Procedures (including critical care time) Labs Review Labs Reviewed - No data to display Imaging Review No  results found.  EKG Interpretation   None       MDM   1. Cellulitis     In question has been outlined with a skin marker.  Patient is to perform warm soaks 4-5 times a day.  She has been started on Bactrim twice a day for one week.  She is to return in 2 days for recheck of her wound, or sooner or she develops, fever, or myalgias.  Tachycardia worsening swelling, or the erythema extends past the outline.    Arman Filter, NP 01/06/13 2042

## 2013-01-06 NOTE — ED Notes (Signed)
Area of redness on L knee marked with surgical marker. Pt instructed on further wound care.

## 2013-01-07 ENCOUNTER — Emergency Department (HOSPITAL_COMMUNITY)
Admission: EM | Admit: 2013-01-07 | Discharge: 2013-01-07 | Disposition: A | Discharge status: Home / Self Care | Payer: Self-pay | Attending: Emergency Medicine | Admitting: Emergency Medicine

## 2013-01-07 ENCOUNTER — Encounter (HOSPITAL_COMMUNITY): Payer: Self-pay | Admitting: Emergency Medicine

## 2013-01-07 DIAGNOSIS — Z880 Allergy status to penicillin: Secondary | ICD-10-CM | POA: Insufficient documentation

## 2013-01-07 DIAGNOSIS — G8929 Other chronic pain: Secondary | ICD-10-CM | POA: Insufficient documentation

## 2013-01-07 DIAGNOSIS — Z79899 Other long term (current) drug therapy: Secondary | ICD-10-CM | POA: Insufficient documentation

## 2013-01-07 DIAGNOSIS — F17200 Nicotine dependence, unspecified, uncomplicated: Secondary | ICD-10-CM | POA: Insufficient documentation

## 2013-01-07 DIAGNOSIS — Z4800 Encounter for change or removal of nonsurgical wound dressing: Secondary | ICD-10-CM | POA: Insufficient documentation

## 2013-01-07 DIAGNOSIS — Z872 Personal history of diseases of the skin and subcutaneous tissue: Secondary | ICD-10-CM | POA: Insufficient documentation

## 2013-01-07 DIAGNOSIS — Z5189 Encounter for other specified aftercare: Secondary | ICD-10-CM

## 2013-01-07 MED ORDER — CEPHALEXIN 500 MG PO CAPS
500.0000 mg | ORAL_CAPSULE | Freq: Once | ORAL | Status: AC
  Administered 2013-01-07: 500 mg via ORAL
  Filled 2013-01-07: qty 1

## 2013-01-07 MED ORDER — CEPHALEXIN 500 MG PO CAPS: 500.0000 mg | ORAL_CAPSULE | Freq: Four times a day (QID) | ORAL | Status: DC

## 2013-01-07 MED ORDER — IBUPROFEN 800 MG PO TABS
800.0000 mg | ORAL_TABLET | Freq: Once | ORAL | Status: AC
  Administered 2013-01-07: 800 mg via ORAL
  Filled 2013-01-07: qty 1

## 2013-01-07 NOTE — ED Provider Notes (Signed)
Medical screening examination/treatment/procedure(s) were performed by non-physician practitioner and as supervising physician I was immediately available for consultation/collaboration.  Arles Rumbold T [place] Scheiderer, MD 01/07/13 2243 

## 2013-01-07 NOTE — ED Notes (Signed)
Pt has erythema to lf leg. She states redness has exceeded the perimeter of the line drawn at the site of inflammation. She has taken her ATB for cellulitis. Lt leg is warm to touch and edematous.

## 2013-01-07 NOTE — ED Notes (Signed)
Pt arrived to ED with a complaint of an abcess that is hurting.  Pt seen here last night for same.  Pt states that the abscess has increased in redness and is sore.  Pt was told that if these conditions were presents that she was to return.

## 2013-01-07 NOTE — ED Provider Notes (Signed)
Medical screening examination/treatment/procedure(s) were performed by non-physician practitioner and as supervising physician I was immediately available for consultation/collaboration.     Gwyneth Sprout, MD 01/07/13 2207

## 2013-01-07 NOTE — ED Provider Notes (Signed)
CSN: [id number]     Arrival date & time 01/07/13  2021 History   First MD Initiated Contact with Patient 01/07/13 2031     Chief Complaint  Patient presents with  . Wound Check   (Consider location/radiation/quality/duration/timing/severity/associated sxs/prior Treatment) HPI Comments: Patient presents for wound check.  Seen yesterday.  Discharged with abx.  Redness has increased minimally.  No fevers, chills, nausea, or vomiting.  Taking abx.  No discharge.  Patient is a 32 y.o. female presenting with wound check. The history is provided by the patient. No language interpreter was used.  Wound Check This is a recurrent problem. The current episode started in the past 7 days. The problem occurs constantly. The problem has been gradually worsening. Pertinent negatives include no abdominal pain, chest pain, chills, fever, nausea or vomiting. Nothing aggravates the symptoms. Treatments tried: antibiotics. The treatment provided no relief.    Past Medical History  Diagnosis Date  . Chronic back pain   . Abscess    History reviewed. No pertinent past surgical history. History reviewed. No pertinent family history. History  Substance Use Topics  . Smoking status: Current Every Day Smoker -- 1.00 packs/day    Types: Cigarettes  . Smokeless tobacco: Never Used  . Alcohol Use: Yes     Comment: occasionally   OB History   Grav Para Term Preterm Abortions TAB SAB Ect Mult Living                 Review of Systems  Constitutional: Negative for fever and chills.  Cardiovascular: Negative for chest pain.  Gastrointestinal: Negative for nausea, vomiting and abdominal pain.  All other systems reviewed and are negative.    Allergies  Penicillins  Home Medications   Current Outpatient Rx  Name  Route  Sig  Dispense  Refill  . baclofen (LIORESAL) 20 MG tablet   Oral   Take 1 tablet (20 mg total) by mouth 3 (three) times daily.   30 each   0   . ibuprofen (ADVIL,MOTRIN) 200 MG  tablet   Oral   Take 400 mg by mouth every 6 (six) hours as needed for pain (pain).          . Multiple Vitamins-Minerals (MULTIVITAMIN WITH MINERALS) tablet   Oral   Take 1 tablet by mouth every morning.          . sulfamethoxazole-trimethoprim (BACTRIM DS) 800-160 MG per tablet   Oral   Take 1 tablet by mouth 2 (two) times daily.   13 tablet   0   . traMADol (ULTRAM) 50 MG tablet   Oral   Take 1 tablet (50 mg total) by mouth every 6 (six) hours as needed for pain.   15 tablet   0    BP 114/68  Pulse 93  Temp(Src) 97.9 F (36.6 C) (Oral)  Resp 20  Wt 165 lb (74.844 kg)  BMI 30.17 kg/m2  SpO2 98%  LMP 12/19/2012 Physical Exam  Nursing note and vitals reviewed. Constitutional: She is oriented to person, place, and time. She appears well-developed and well-nourished.  HENT:  Head: Normocephalic and atraumatic.  Eyes: Conjunctivae and EOM are normal.  Neck: Normal range of motion.  Cardiovascular: Normal rate.   Pulmonary/Chest: Effort normal.  Abdominal: She exhibits no distension.  Musculoskeletal: Normal range of motion.  No pain with passive ROM  Neurological: She is alert and oriented to person, place, and time.  Skin: Skin is dry.     Cellulitis on left  knee, no abscess  Psychiatric: She has a normal mood and affect. Her behavior is normal. Judgment and thought content normal.    ED Course  Procedures (including critical care time) Labs Review Labs Reviewed - No data to display Imaging Review No results found.  EKG Interpretation   None       MDM   1. Wound check, abscess     Patient with minimally increased erythema.  No signs of abscess or septic knee.  Appears to be cellulitis.  I looked at the area with [place], no visible fluid collection.  Will add keflex.  Will give keflex here, and observe.  Patient has penicillin allergy, only of rash.    Roxy Horseman, PA-C 01/07/13 2201

## 2013-04-23 ENCOUNTER — Encounter (HOSPITAL_COMMUNITY): Payer: Self-pay | Admitting: Emergency Medicine

## 2013-04-23 ENCOUNTER — Emergency Department (INDEPENDENT_AMBULATORY_CARE_PROVIDER_SITE_OTHER)
Admission: EM | Admit: 2013-04-23 | Discharge: 2013-04-23 | Disposition: A | Discharge status: Home / Self Care | Payer: BC Managed Care – PPO | Source: Home / Self Care | Attending: Emergency Medicine | Admitting: Emergency Medicine

## 2013-04-23 DIAGNOSIS — L509 Urticaria, unspecified: Secondary | ICD-10-CM

## 2013-04-23 MED ORDER — DIPHENHYDRAMINE HCL 50 MG/ML IJ SOLN
INTRAMUSCULAR | Status: AC
  Filled 2013-04-23: qty 1

## 2013-04-23 MED ORDER — PREDNISONE 20 MG PO TABS: ORAL_TABLET | ORAL | Status: DC

## 2013-04-23 MED ORDER — HYDROXYZINE HCL 25 MG PO TABS: 25.0000 mg | ORAL_TABLET | Freq: Three times a day (TID) | ORAL | Status: DC

## 2013-04-23 MED ORDER — DEXAMETHASONE SODIUM PHOSPHATE 10 MG/ML IJ SOLN
10.0000 mg | Freq: Once | INTRAMUSCULAR | Status: AC
  Administered 2013-04-23: 10 mg via INTRAMUSCULAR

## 2013-04-23 MED ORDER — DIPHENHYDRAMINE HCL 50 MG/ML IJ SOLN
50.0000 mg | Freq: Once | INTRAMUSCULAR | Status: AC
  Administered 2013-04-23: 50 mg via INTRAMUSCULAR

## 2013-04-23 MED ORDER — RANITIDINE HCL 150 MG PO CAPS: 150.0000 mg | ORAL_CAPSULE | Freq: Every day | ORAL | Status: DC

## 2013-04-23 MED ORDER — DEXAMETHASONE SODIUM PHOSPHATE 10 MG/ML IJ SOLN
INTRAMUSCULAR | Status: AC
  Filled 2013-04-23: qty 1

## 2013-04-23 NOTE — ED Notes (Signed)
Patient c/o hives all over x 3 days. Visibly notable on face and hands. Pt denies changes in diet or in hygiene products. Pt is alert and oriented.

## 2013-04-23 NOTE — Discharge Instructions (Signed)

## 2013-04-23 NOTE — ED Provider Notes (Signed)
  Chief Complaint    Chief Complaint  Patient presents with  . Urticaria    History of Present Illness      Breanna Garcia is a 33 year old female who's had a three-day history of an urticarial rash on her face, chest, back, arms, and legs. This is very itchy. She denies any new foods or medications. No bites or stings. No exposure to any contact allergens or antigens such as soaps, detergents, washing powders, dryer sheets, fabric softener. No new cosmetics or chemicals. She denies being sick in any way with fever, URI or GI symptoms, or sinus or dental infections.  Review of Systems   Other than as noted above, the patient denies any of the following symptoms: Systemic:  No fever, chills, or myalgias. ENT:  No nasal congestion, rhinorrhea, sore throat, swelling of lips, tongue or throat. Resp:  No cough, wheezing, or shortness of breath.  PMFSH    Past medical history, family history, social history, meds, and allergies were reviewed.   Physical Exam     Vital signs:  BP 122/75  Pulse 96  Temp(Src) 98.2 F (36.8 C) (Oral)  Resp 18  SpO2 98%  LMP 04/12/2013 Gen:  Alert, oriented, in no distress. ENT:  Pharynx clear, no intraoral lesions, moist mucous membranes. Lungs:  Clear to auscultation. Skin:  She has an urticarial rash on her face, extremities, and trunk.  Course in Urgent Care Center     She was given Benadryl 50 mg IM and Decadron 10 mg IM.  Assessment    The encounter diagnosis was Urticaria.  Plan     1.  Meds:  The following meds were prescribed:   Discharge Medication List as of 04/23/2013  5:02 PM    START taking these medications   Details  hydrOXYzine (ATARAX/VISTARIL) 25 MG tablet Take 1 tablet (25 mg total) by mouth 3 (three) times daily., Starting 04/23/2013, Until Discontinued, Normal    predniSONE (DELTASONE) 20 MG tablet 3 daily for 5 days, 2 daily for 5 days, 1 daily for 5 days., Normal    ranitidine (ZANTAC) 150 MG capsule Take 1 capsule (150  mg total) by mouth daily., Starting 04/23/2013, Until Discontinued, Normal        2.  Patient Education/Counseling:  The patient was given appropriate handouts, self care instructions, and instructed in symptomatic relief.    3.  Follow up:  The patient was told to follow up here if no better in 3 to 4 days, or sooner if becoming worse in any way, and given some red flag symptoms such as worsening rash, fever, or difficulty breathing which would prompt immediate return.  Follow up with a dermatologist if no improvement on above treatment.      Reuben Likesavid C Soma Bachand, MD 04/23/13 2139

## 2013-07-06 ENCOUNTER — Emergency Department (HOSPITAL_COMMUNITY): Payer: BC Managed Care – PPO

## 2013-07-06 ENCOUNTER — Encounter (HOSPITAL_COMMUNITY): Payer: Self-pay | Admitting: Emergency Medicine

## 2013-07-06 ENCOUNTER — Emergency Department (HOSPITAL_COMMUNITY)
Admission: EM | Admit: 2013-07-06 | Discharge: 2013-07-06 | Disposition: A | Discharge status: Home / Self Care | Payer: BC Managed Care – PPO | Attending: Emergency Medicine | Admitting: Emergency Medicine

## 2013-07-06 DIAGNOSIS — Z880 Allergy status to penicillin: Secondary | ICD-10-CM | POA: Insufficient documentation

## 2013-07-06 DIAGNOSIS — Z792 Long term (current) use of antibiotics: Secondary | ICD-10-CM | POA: Insufficient documentation

## 2013-07-06 DIAGNOSIS — Z79899 Other long term (current) drug therapy: Secondary | ICD-10-CM | POA: Insufficient documentation

## 2013-07-06 DIAGNOSIS — F17200 Nicotine dependence, unspecified, uncomplicated: Secondary | ICD-10-CM | POA: Insufficient documentation

## 2013-07-06 DIAGNOSIS — IMO0002 Reserved for concepts with insufficient information to code with codable children: Secondary | ICD-10-CM | POA: Insufficient documentation

## 2013-07-06 DIAGNOSIS — Y9389 Activity, other specified: Secondary | ICD-10-CM | POA: Insufficient documentation

## 2013-07-06 DIAGNOSIS — S9032XA Contusion of left foot, initial encounter: Secondary | ICD-10-CM

## 2013-07-06 DIAGNOSIS — S9030XA Contusion of unspecified foot, initial encounter: Secondary | ICD-10-CM | POA: Insufficient documentation

## 2013-07-06 DIAGNOSIS — G8929 Other chronic pain: Secondary | ICD-10-CM | POA: Insufficient documentation

## 2013-07-06 DIAGNOSIS — Z23 Encounter for immunization: Secondary | ICD-10-CM | POA: Insufficient documentation

## 2013-07-06 DIAGNOSIS — Z872 Personal history of diseases of the skin and subcutaneous tissue: Secondary | ICD-10-CM | POA: Insufficient documentation

## 2013-07-06 DIAGNOSIS — W208XXA Other cause of strike by thrown, projected or falling object, initial encounter: Secondary | ICD-10-CM | POA: Insufficient documentation

## 2013-07-06 DIAGNOSIS — Y9229 Other specified public building as the place of occurrence of the external cause: Secondary | ICD-10-CM | POA: Insufficient documentation

## 2013-07-06 MED ORDER — TETANUS-DIPHTH-ACELL PERTUSSIS 5-2.5-18.5 LF-MCG/0.5 IM SUSP
0.5000 mL | Freq: Once | INTRAMUSCULAR | Status: AC
  Administered 2013-07-06: 0.5 mL via INTRAMUSCULAR
  Filled 2013-07-06: qty 0.5

## 2013-07-06 NOTE — ED Notes (Signed)
Pt was was at home [employer] tonight and a heavy shelf unit fell on pts lt foot. Constatnt pain with swelling and abrasion to top of foot with bleeding controlled. Has not taken anything for pain. Has ROM to extremities with erythremia to quarter size area to top of foot.

## 2013-07-06 NOTE — Discharge Instructions (Signed)
Take up to 800mg  of ibuprofen three times a day for the next 3-4 days (take with food).  Ice 3 times a day for 15-20 minutes.  Elevate when possible to decrease swelling and pain.  Activity as tolerated.  You may return to the ER if your pain worsens or you have any other concerns.    Foot Contusion A foot contusion is a deep bruise to the foot. Contusions are the result of an injury that caused bleeding under the skin. The contusion may turn blue, purple, or yellow. Minor injuries will give you a painless contusion, but more severe contusions may stay painful and swollen for a few weeks. CAUSES  A foot contusion comes from a direct blow to that area, such as a heavy object falling on the foot. SYMPTOMS   Swelling of the foot.  Discoloration of the foot.  Tenderness or soreness of the foot. DIAGNOSIS  You will have a physical exam and will be asked about your history. You may need an X-ray of your foot to look for a broken bone (fracture).  TREATMENT  An elastic wrap may be recommended to support your foot. Resting, elevating, and applying cold compresses to your foot are often the best treatments for a foot contusion. Over-the-counter medicines may also be recommended for pain control. HOME CARE INSTRUCTIONS   Put ice on the injured area.  Put ice in a plastic bag.  Place a towel between your skin and the bag.  Leave the ice on for 15-20 minutes, 03-04 times a day.  Only take over-the-counter or prescription medicines for pain, discomfort, or fever as directed by your caregiver.  If told, use an elastic wrap as directed. This can help reduce swelling. You may remove the wrap for sleeping, showering, and bathing. If your toes become numb, cold, or blue, take the wrap off and reapply it more loosely.  Elevate your foot with pillows to reduce swelling.  Try to avoid standing or walking while the foot is painful. Do not resume use until instructed by your caregiver. Then, begin use  gradually. If pain develops, decrease use. Gradually increase activities that do not cause discomfort until you have normal use of your foot.  See your caregiver as directed. It is very important to keep all follow-up appointments in order to avoid any lasting problems with your foot, including long-term (chronic) pain. SEEK IMMEDIATE MEDICAL CARE IF:   You have increased redness, swelling, or pain in your foot.  Your swelling or pain is not relieved with medicines.  You have loss of feeling in your foot or are unable to move your toes.  Your foot turns cold or blue.  You have pain when you move your toes.  Your foot becomes warm to the touch.  Your contusion does not improve in 2 days. MAKE SURE YOU:   Understand these instructions.  Will watch your condition.  Will get help right away if you are not doing well or get worse. Document Released: 12/22/2005 Document Revised: 09/01/2011 Document Reviewed: 02/03/2011 [facility] LLCExitCare Patient Information 2014 [place], [place].

## 2013-07-06 NOTE — ED Provider Notes (Signed)
CSN: [id number]     Arrival date & time 07/06/13  2039 History  This chart was scribed for non-physician practitioner Gwendalyn EgeKaty Kenetra Hildenbrand, PA-C working with Audree CamelScott T Goldston, MD by Joaquin MusicKristina Sanchez-Matthews, ED Scribe. This patient was seen in room WTR7/WTR7 and the patient's care was started at 9:42 PM .     Chief Complaint  Patient presents with  . Foot Pain   The history is provided by the patient. No language interpreter was used.   HPI Comments: Breanna Garcia is a 33 y.o. female who presents to the Emergency Department complaining of L foot pain secondary to a heavy shelf falling on foot tonight 3 hours ago. Pt states she was shopping at a [employer] store and reports having a heavy shelf unit fall onto her L foot. She states the metal and wood shelf holds 4,000-lbs. She states she can bear weight to the outside of her foot more so than the inside. She is unsure when her last tetanus was. Pt denies numbness, tingling, ankle pain and any other injuries.   Past Medical History  Diagnosis Date  . Chronic back pain   . Abscess    History reviewed. No pertinent past surgical history. No family history on file. History  Substance Use Topics  . Smoking status: Current Every Day Smoker -- 0.50 packs/day    Types: Cigarettes  . Smokeless tobacco: Never Used  . Alcohol Use: Yes     Comment: occasionally   OB History   Grav Para Term Preterm Abortions TAB SAB Ect Mult Living                 Review of Systems  Skin: Positive for color change and wound.  Neurological: Negative for weakness and numbness.  All other systems reviewed and are negative.  Allergies  Penicillins  Home Medications   Prior to Admission medications   Medication Sig Start Date End Date Taking? Authorizing Provider  baclofen (LIORESAL) 20 MG tablet Take 1 tablet (20 mg total) by mouth 3 (three) times daily. 05/15/12   Tiffany Irine SealG Greene, PA-C  cephALEXin (KEFLEX) 500 MG capsule Take 1 capsule (500 mg total) by mouth  4 (four) times daily. 01/07/13   Roxy Horsemanobert Browning, PA-C  hydrOXYzine (ATARAX/VISTARIL) 25 MG tablet Take 1 tablet (25 mg total) by mouth 3 (three) times daily. 04/23/13   Reuben Likesavid C Keller, MD  ibuprofen (ADVIL,MOTRIN) 200 MG tablet Take 400 mg by mouth every 6 (six) hours as needed for pain (pain).     Historical Provider, MD  Multiple Vitamins-Minerals (MULTIVITAMIN WITH MINERALS) tablet Take 1 tablet by mouth every morning.     Historical Provider, MD  predniSONE (DELTASONE) 20 MG tablet 3 daily for 5 days, 2 daily for 5 days, 1 daily for 5 days. 04/23/13   Reuben Likesavid C Keller, MD  ranitidine (ZANTAC) 150 MG capsule Take 1 capsule (150 mg total) by mouth daily. 04/23/13   Reuben Likesavid C Keller, MD  sulfamethoxazole-trimethoprim (BACTRIM DS) 800-160 MG per tablet Take 1 tablet by mouth 2 (two) times daily. 01/06/13   Arman FilterGail K Schulz, NP  traMADol (ULTRAM) 50 MG tablet Take 1 tablet (50 mg total) by mouth every 6 (six) hours as needed for pain. 01/06/13   Arman FilterGail K Schulz, NP   BP 122/78  Pulse 78  Temp(Src) 97.2 F (36.2 C) (Oral)  Resp 22  SpO2 100%  Physical Exam  Nursing note and vitals reviewed. Constitutional: She is oriented to person, place, and time. She appears  well-developed and well-nourished. No distress.  HENT:  Head: Normocephalic and atraumatic.  Eyes:  Normal appearance  Neck: Normal range of motion.  Pulmonary/Chest: Effort normal.  Musculoskeletal: Normal range of motion. She exhibits tenderness.  Superficial, hemastatic, abrasion dorsal surface to second metatarsal. Mild edema and tenderness to same location. Pain with active extension of toes as well as inversion and eversion of foot. Normal L ankle. 2+ DP pulse and distal sensation intact.  Neurological: She is alert and oriented to person, place, and time.  Psychiatric: She has a normal mood and affect. Her behavior is normal.   ED Course  Procedures DIAGNOSTIC STUDIES: Oxygen Saturation is 100% on RA, normal by my interpretation.     COORDINATION OF CARE: 9:47 PM-Discussed treatment plan which includes discussed radiology findings. Will update Tetanus during today's ED visit. Reccommended pt to take Ibuprofen PRN, ice to L foot, and watch L foot for possible infection. Offered Tylenol & crutches but pt declined.  Pt agreed to plan.   Labs Review Labs Reviewed - No data to display  Imaging Review Dg Foot Complete Left  07/06/2013   CLINICAL DATA:  Left foot pain abrasion after injury.  EXAM: LEFT FOOT - COMPLETE 3+ VIEW  COMPARISON:  DG ANKLE COMPLETE*L* dated 09/08/2007; DG TIBIA/FIBULA*L* dated 09/08/2007; DG ANKLE COMPLETE*L* dated 01/29/2007  FINDINGS: Dorsal soft tissue swelling. No evidence of acute fracture or dislocation. No focal bone lesion or bone destruction.  IMPRESSION: Soft tissue swelling.  No acute bony abnormalities.   Electronically Signed   By: Burman NievesWilliam  Stevens M.D.   On: 07/06/2013 21:22     EKG Interpretation None     MDM   Final diagnoses:  Contusion of left foot    Pt presents w/ blunt trauma to L foot.  Exam sig for contusion and superficial abrasion to dorsal surface and intact NV.  Xray neg for fx/dislocation.  Tetanus updated.  Ortho tech provided w/ post-op shoe because pain aggravated by extension of toes.  Recommended tylenol/motrin prn, ice and elevation.  Return precautions discussed.   I personally performed the services described in this documentation, which was scribed in my presence. The recorded information has been reviewed and is accurate.    Otilio Miuatherine E Caroll Cunnington, PA-C 07/07/13 [phone]

## 2013-07-10 NOTE — ED Provider Notes (Signed)
Medical screening examination/treatment/procedure(s) were performed by non-physician practitioner and as supervising physician I was immediately available for consultation/collaboration.   EKG Interpretation None        Lem Peary T Markesia Crilly, MD 07/10/13 0712 

## 2013-09-13 DIAGNOSIS — F411 Generalized anxiety disorder: Secondary | ICD-10-CM | POA: Insufficient documentation

## 2013-12-31 ENCOUNTER — Encounter (HOSPITAL_COMMUNITY): Payer: Self-pay | Admitting: Emergency Medicine

## 2013-12-31 ENCOUNTER — Emergency Department (HOSPITAL_COMMUNITY)
Admission: EM | Admit: 2013-12-31 | Discharge: 2013-12-31 | Disposition: A | Discharge status: Home / Self Care | Payer: BC Managed Care – PPO | Attending: Emergency Medicine | Admitting: Emergency Medicine

## 2013-12-31 DIAGNOSIS — Z872 Personal history of diseases of the skin and subcutaneous tissue: Secondary | ICD-10-CM | POA: Insufficient documentation

## 2013-12-31 DIAGNOSIS — Z880 Allergy status to penicillin: Secondary | ICD-10-CM | POA: Insufficient documentation

## 2013-12-31 DIAGNOSIS — G8929 Other chronic pain: Secondary | ICD-10-CM | POA: Insufficient documentation

## 2013-12-31 DIAGNOSIS — Z79899 Other long term (current) drug therapy: Secondary | ICD-10-CM | POA: Insufficient documentation

## 2013-12-31 DIAGNOSIS — K088 Other specified disorders of teeth and supporting structures: Secondary | ICD-10-CM | POA: Insufficient documentation

## 2013-12-31 DIAGNOSIS — K0889 Other specified disorders of teeth and supporting structures: Secondary | ICD-10-CM

## 2013-12-31 DIAGNOSIS — Z792 Long term (current) use of antibiotics: Secondary | ICD-10-CM | POA: Insufficient documentation

## 2013-12-31 DIAGNOSIS — Z720 Tobacco use: Secondary | ICD-10-CM | POA: Insufficient documentation

## 2013-12-31 MED ORDER — CLINDAMYCIN HCL 300 MG PO CAPS: 300.0000 mg | ORAL_CAPSULE | Freq: Four times a day (QID) | ORAL | Status: DC

## 2013-12-31 MED ORDER — HYDROCODONE-ACETAMINOPHEN 5-325 MG PO TABS: 2.0000 | ORAL_TABLET | ORAL | Status: DC | PRN

## 2013-12-31 NOTE — Discharge Instructions (Signed)
Dental Pain °A tooth ache may be caused by cavities (tooth decay). Cavities expose the nerve of the tooth to air and hot or cold temperatures. It may come from an infection or abscess (also called a boil or furuncle) around your tooth. It is also often caused by dental caries (tooth decay). This causes the pain you are having. °DIAGNOSIS  °Your caregiver can diagnose this problem by exam. °TREATMENT  °· If caused by an infection, it may be treated with medications which kill germs (antibiotics) and pain medications as prescribed by your caregiver. Take medications as directed. °· Only take over-the-counter or prescription medicines for pain, discomfort, or fever as directed by your caregiver. °· Whether the tooth ache today is caused by infection or dental disease, you should see your dentist as soon as possible for further care. °SEEK MEDICAL CARE IF: °The exam and treatment you received today has been provided on an emergency basis only. This is not a substitute for complete medical or dental care. If your problem worsens or new problems (symptoms) appear, and you are unable to meet with your dentist, call or return to this location. °SEEK IMMEDIATE MEDICAL CARE IF:  °· You have a fever. °· You develop redness and swelling of your face, jaw, or neck. °· You are unable to open your mouth. °· You have severe pain uncontrolled by pain medicine. °MAKE SURE YOU:  °· Understand these instructions. °· Will watch your condition. °· Will get help right away if you are not doing well or get worse. °Document Released: 03/02/2005 Document Revised: 05/25/2011 Document Reviewed: 10/19/2007 °ExitCare® Patient Information ©2015 [employer]. This information is not intended to replace advice given to you by your health care provider. Make sure you discuss any questions you have with your health care provider. ° °Dental Care and Dentist Visits °Dental care supports good overall health. Regular dental visits can also help you  avoid dental pain, bleeding, infection, and other more serious health problems in the future. It is important to keep the mouth healthy because diseases in the teeth, gums, and other oral tissues can spread to other areas of the body. Some problems, such as diabetes, heart disease, and pre-term labor have been associated with poor oral health.  °See your dentist every 6 months. If you experience emergency problems such as a toothache or broken tooth, go to the dentist right away. If you see your dentist regularly, you may catch problems early. It is easier to be treated for problems in the early stages.  °WHAT TO EXPECT AT A DENTIST VISIT  °Your dentist will look for many common oral health problems and recommend proper treatment. At your regular dental visit, you can expect: °· Gentle cleaning of the teeth and gums. This includes scraping and polishing. This helps to remove the sticky substance around the teeth and gums (plaque). Plaque forms in the mouth shortly after eating. Over time, plaque hardens on the teeth as tartar. If tartar is not removed regularly, it can cause problems. Cleaning also helps remove stains. °· Periodic X-rays. These pictures of the teeth and supporting bone will help your dentist assess the health of your teeth. °· Periodic fluoride treatments. Fluoride is a natural mineral shown to help strengthen teeth. Fluoride treatment involves applying a fluoride gel or varnish to the teeth. It is most commonly done in children. °· Examination of the mouth, tongue, jaws, teeth, and gums to look for any oral health problems, such as: °¨ Cavities (dental caries). This is   decay on the tooth caused by plaque, sugar, and acid in the mouth. It is best to catch a cavity when it is small. °¨ Inflammation of the gums caused by plaque buildup (gingivitis). °¨ Problems with the mouth or malformed or misaligned teeth. °¨ Oral cancer or other diseases of the soft tissues or jaws.  °KEEP YOUR TEETH AND GUMS  HEALTHY °For healthy teeth and gums, follow these general guidelines as well as your dentist's specific advice: °· Have your teeth professionally cleaned at the dentist every 6 months. °· Brush twice daily with a fluoride toothpaste. °· Floss your teeth daily.  °· Ask your dentist if you need fluoride supplements, treatments, or fluoride toothpaste. °· Eat a healthy diet. Reduce foods and drinks with added sugar. °· Avoid smoking. °TREATMENT FOR ORAL HEALTH PROBLEMS °If you have oral health problems, treatment varies depending on the conditions present in your teeth and gums. °· Your caregiver will most likely recommend good oral hygiene at each visit. °· For cavities, gingivitis, or other oral health disease, your caregiver will perform a procedure to treat the problem. This is typically done at a separate appointment. Sometimes your caregiver will refer you to another dental specialist for specific tooth problems or for surgery. °SEEK IMMEDIATE DENTAL CARE IF: °· You have pain, bleeding, or soreness in the gum, tooth, jaw, or mouth area. °· A permanent tooth becomes loose or separated from the gum socket. °· You experience a blow or injury to the mouth or jaw area. °Document Released: 11/12/2010 Document Revised: 05/25/2011 Document Reviewed: 11/12/2010 °ExitCare® Patient Information ©2015 [employer]. This information is not intended to replace advice given to you by your health care provider. Make sure you discuss any questions you have with your health care provider. ° °

## 2013-12-31 NOTE — ED Provider Notes (Signed)
CSN: [id number]     Arrival date & time 12/31/13  1008 History   First MD Initiated Contact with Patient 12/31/13 1031     Chief Complaint  Patient presents with  . Dental Pain     (Consider location/radiation/quality/duration/timing/severity/associated sxs/prior Treatment) HPI Breanna Garcia is a 33 y.o. female and presents for evaluation of tooth pain. Patient states on Wednesday or Thursday her back left upper molars began to hurt. She does have a dentist, Dr. Phylliss Bobowe, but did not make an appointment. States she will go to the office on Monday for further evaluation. She is tried Tylenol, ibuprofen, salt water gargles and ice packs with minimal relief. Chewing exacerbates the pain. She denies any difficulties breathing or swallowing. No fevers at home  Past Medical History  Diagnosis Date  . Chronic back pain   . Abscess    History reviewed. No pertinent past surgical history. History reviewed. No pertinent family history. History  Substance Use Topics  . Smoking status: Current Every Day Smoker -- 0.50 packs/day for 20 years    Types: Cigarettes  . Smokeless tobacco: Never Used  . Alcohol Use: Yes     Comment: occasionally   OB History   Grav Para Term Preterm Abortions TAB SAB Ect Mult Living                 Review of Systems  Constitutional: Negative for fever.  HENT: Positive for dental problem. Negative for drooling and ear pain.        Tooth pain  Respiratory: Negative for shortness of breath.   Cardiovascular: Negative for chest pain.  Skin: Negative for rash.      Allergies  Penicillins  Home Medications   Prior to Admission medications   Medication Sig Start Date End Date Taking? Authorizing Provider  baclofen (LIORESAL) 20 MG tablet Take 1 tablet (20 mg total) by mouth 3 (three) times daily. 05/15/12   Tiffany Irine SealG Greene, PA-C  cephALEXin (KEFLEX) 500 MG capsule Take 1 capsule (500 mg total) by mouth 4 (four) times daily. 01/07/13   Roxy Horsemanobert Browning, PA-C   hydrOXYzine (ATARAX/VISTARIL) 25 MG tablet Take 1 tablet (25 mg total) by mouth 3 (three) times daily. 04/23/13   Reuben Likesavid C Keller, MD  ibuprofen (ADVIL,MOTRIN) 200 MG tablet Take 400 mg by mouth every 6 (six) hours as needed for pain (pain).     Historical Provider, MD  Multiple Vitamins-Minerals (MULTIVITAMIN WITH MINERALS) tablet Take 1 tablet by mouth every morning.     Historical Provider, MD  predniSONE (DELTASONE) 20 MG tablet 3 daily for 5 days, 2 daily for 5 days, 1 daily for 5 days. 04/23/13   Reuben Likesavid C Keller, MD  ranitidine (ZANTAC) 150 MG capsule Take 1 capsule (150 mg total) by mouth daily. 04/23/13   Reuben Likesavid C Keller, MD  sulfamethoxazole-trimethoprim (BACTRIM DS) 800-160 MG per tablet Take 1 tablet by mouth 2 (two) times daily. 01/06/13   Arman FilterGail K Schulz, NP  traMADol (ULTRAM) 50 MG tablet Take 1 tablet (50 mg total) by mouth every 6 (six) hours as needed for pain. 01/06/13   Arman FilterGail K Schulz, NP   BP 116/71  Pulse 78  Temp(Src) 97.7 F (36.5 C) (Oral)  Resp 20  SpO2 100%  LMP 12/25/2013 Physical Exam  Nursing note and vitals reviewed. Constitutional:  Awake, alert, nontoxic appearance.  HENT:  Head: Atraumatic.  Oropharynx is clear and moist, no gingival erythema or swelling. Tenderness diffusely to the left maxilla. No fluctuance or evidence  of a drainable abscess. No erosion or exposure of dentin or pulp appreciated. No lymphadenopathy or submandibular tenderness. No tonsillar swelling, uvula midline. Patent airway  Eyes: Right eye exhibits no discharge. Left eye exhibits no discharge.  Neck: Neck supple.  Pulmonary/Chest: Effort normal. She exhibits no tenderness.  Abdominal: Soft. There is no tenderness. There is no rebound.  Musculoskeletal: She exhibits no tenderness.  Baseline ROM, no obvious new focal weakness.  Neurological:  Mental status and motor strength appears baseline for patient and situation.  Skin: No rash noted.  Psychiatric: She has a normal mood and affect.     ED Course  Procedures (including critical care time) Labs Review Labs Reviewed - No data to display  Imaging Review No results found.   EKG Interpretation None      MDM  Vitals stable - WNL -afebrile Pt resting comfortably in ED. PE not concerning for acute or emergent pathology. Doubt peritonsillar abscess, osteomyelitis, Vincent's or Ludwig angina. Patient reports she will followup with her dentist on Monday. Will DC with clindamycin as patient is allergic to penicillins and Norco for pain control. Discussed f/u with PCP and return precautions, pt very amenable to plan. Patient stable, in good condition and is appropriate for discharge.   Final diagnoses:  Tooth pain        Sharlene MottsBenjamin W Diogo Anne, PA-C 12/31/13 1057

## 2013-12-31 NOTE — ED Notes (Signed)
Pt reports she needs dental work done. Reports left sided upper jaw pain starting on Wednesday. Pain 10/10. Reports this morning had upper jaw swelling and noticed some pus. No pus noted at this time.

## 2014-01-02 NOTE — ED Provider Notes (Signed)
Medical screening examination/treatment/procedure(s) were performed by non-physician practitioner and as supervising physician I was immediately available for consultation/collaboration.     Lamonica Trueba E Nardos Putnam, MD 01/02/14 1316 

## 2014-04-19 ENCOUNTER — Ambulatory Visit: Payer: Self-pay | Admitting: Family

## 2014-04-24 ENCOUNTER — Encounter: Payer: Self-pay | Admitting: Family

## 2014-04-24 ENCOUNTER — Ambulatory Visit (INDEPENDENT_AMBULATORY_CARE_PROVIDER_SITE_OTHER): Payer: 59 | Admitting: Family

## 2014-04-24 VITALS — BP 144/86 | HR 79 | Temp 98.1°F | Resp 18 | Ht 62.0 in | Wt 200.8 lb

## 2014-04-24 DIAGNOSIS — G43909 Migraine, unspecified, not intractable, without status migrainosus: Secondary | ICD-10-CM | POA: Insufficient documentation

## 2014-04-24 DIAGNOSIS — Z720 Tobacco use: Secondary | ICD-10-CM

## 2014-04-24 DIAGNOSIS — F17200 Nicotine dependence, unspecified, uncomplicated: Secondary | ICD-10-CM | POA: Insufficient documentation

## 2014-04-24 DIAGNOSIS — L409 Psoriasis, unspecified: Secondary | ICD-10-CM

## 2014-04-24 DIAGNOSIS — G43809 Other migraine, not intractable, without status migrainosus: Secondary | ICD-10-CM

## 2014-04-24 MED ORDER — BETAMETHASONE VALERATE 0.1 % EX OINT: 1.0000 "application " | TOPICAL_OINTMENT | Freq: Two times a day (BID) | CUTANEOUS | Status: DC

## 2014-04-24 NOTE — Assessment & Plan Note (Signed)
Symptoms and exam of right foot consistent with psoriasis. Start betamethasone ointment. Refer for to dermatology per patient request. Follow up if symptoms worsen or fail to improve.

## 2014-04-24 NOTE — Progress Notes (Signed)
Pre visit review using our clinic review tool, if applicable. No additional management support is needed unless otherwise documented below in the visit note. 

## 2014-04-24 NOTE — Progress Notes (Signed)
Subjective:    Patient ID: Breanna Garcia, female    DOB: 1981-02-23, 34 y.o.   MRN: [id number]  Chief Complaint  Patient presents with  . Establish Care    rash on foot that has been there for 3 years, referral to dermatologist     HPI:  Breanna Garcia is a 34 y.o. female who presents today to establish care and discuss a rash on her foot and quitting smoking.  1) Rash on foot - associated symptom of ithcy, red rash on her foot that gets gets silvery and cracks. Has previously been treated with steroids which made it somewhat better, antifungals made it worse. A culture was performed and was not fungal.   2) Tobacco use - Has been smoking for about 20 years; has cut back to a couple of cigarettes and used e-cigarettes before. Denies any medications, patches or gums. Indicates that fixation may be the main problem.   3) Migraine headaches - stable with current OTC treatments.   Allergies  Allergen Reactions  . Penicillins Hives    No current outpatient prescriptions on file prior to visit.   No current facility-administered medications on file prior to visit.    Past Medical History  Diagnosis Date  . Chronic back pain   . Abscess   . Anxiety     Past Surgical History  Procedure Laterality Date  . Tonsillectomy and adenoidectomy    . Wisdom tooth extraction      Family History  Problem Relation Age of Onset  . Hypothyroidism Mother   . Stroke Maternal Grandmother   . Hypertension Maternal Grandfather   . Heart disease Maternal Grandfather     History   Social History  . Marital Status: Single    Spouse Name: N/A    Number of Children: 0  . Years of Education: 15   Occupational History  . Manager of [employer]    Social History Main Topics  . Smoking status: Current Every Day Smoker -- 0.50 packs/day for 20 years    Types: Cigarettes  . Smokeless tobacco: Never Used  . Alcohol Use: Yes     Comment: occasionally  . Drug Use: No  . Sexual  Activity: Yes    Birth Control/ Protection: Pill   Other Topics Concern  . Not on file   Social History Narrative   [place], [place] Currently resides in a house with her mother. 5 dogs. Fun: Works all the time.    Denies religious beliefs that would effect health care.     Review of Systems  Respiratory: Negative for cough, chest tightness, shortness of breath and wheezing.   Skin: Positive for rash.  Neurological: Negative for headaches.      Objective:    BP 144/86 mmHg  Pulse 79  Temp(Src) 98.1 F (36.7 C) (Oral)  Resp 18  Ht 5\' 2"  (1.575 m)  Wt 200 lb 12.8 oz (91.082 kg)  BMI 36.72 kg/m2  SpO2 97% Nursing note and vital signs reviewed.  Physical Exam  Constitutional: She is oriented to person, place, and time. She appears well-developed and well-nourished. No distress.  Cardiovascular: Normal rate, regular rhythm, normal heart sounds and intact distal pulses.   Pulmonary/Chest: Effort normal and breath sounds normal.  Neurological: She is alert and oriented to person, place, and time.  Skin: Skin is warm and dry.  Right foot: Rash noted plantar surface of right foot. Some silvery plaque present. Appears dry.  Psychiatric: She has a normal mood and affect. Her behavior is normal. Judgment and thought content normal.       Assessment & Plan:

## 2014-04-24 NOTE — Patient Instructions (Addendum)
Thank you for choosing [facility].  Summary/Instructions:  Psoriasis Psoriasis is a common, long-lasting (chronic) inflammation of the skin. It affects both men and women equally, of all ages and all races. Psoriasis cannot be passed from person to person (not contagious). Psoriasis varies from mild to very severe. When severe, it can greatly affect your quality of life. Psoriasis is an inflammatory disorder affecting the skin as well as other organs including the joints (causing an arthritis). With psoriasis, the skin sheds its top layer of cells more rapidly than it does in someone without psoriasis. CAUSES  The cause of psoriasis is largely unknown. Genetics, your immune system, and the environment seem to play a role in causing psoriasis. Factors that can make psoriasis worse include:  Damage or trauma to the skin, such as cuts, scrapes, and sunburn. This damage often causes new areas of psoriasis (lesions).  Winter dryness and lack of sunlight.  Medicines such as lithium, beta-blockers, antimalarial drugs, ACE inhibitors, nonsteroidal anti-inflammatory drugs (ibuprofen, aspirin), and terbinafine. Let your caregiver know if you are taking any of these drugs.  Alcohol. Excessive alcohol use should be avoided if you have psoriasis. Drinking large amounts of alcohol can affect:  How well your psoriasis treatment works.  How safe your psoriasis treatment is.  Smoking. If you smoke, ask your caregiver for help to quit.  Stress.  Bacterial or viral infections.  Arthritis. Arthritis associated with psoriasis (psoriatic arthritis) affects less than 10% of patients with psoriasis. The arthritic intensity does not always match the skin psoriasis intensity. It is important to let your caregiver know if your joints hurt or if they are stiff. SYMPTOMS  The most common form of psoriasis begins with little red bumps that gradually become larger. The bumps begin to form scales that flake off  easily. The lower layers of scales stick together. When these scales are scratched or removed, the underlying skin is tender and bleeds easily. These areas then grow in size and may become large. Psoriasis often creates a rash that looks the same on both sides of the body (symmetrical). It often affects the elbows, knees, groin, genitals, arms, legs, scalp, and nails. Affected nails often have pitting, loosen, thicken, crumble, and are difficult to treat.  "Inverse psoriasis"occurs in the armpits, under breasts, in skin folds, and around the groin, buttocks, and genitals.  "Guttate psoriasis" generally occurs in children and young adults following a recent sore throat (strep throat). It begins with many small, red, scaly spots on the skin. It clears spontaneously in weeks or a few months without treatment. DIAGNOSIS  Psoriasis is diagnosed by physical exam. A tissue sample (biopsy) may also be taken. TREATMENT The treatment of psoriasis depends on your age, health, and living conditions.  Steroid (cortisone) creams, lotions, and ointments may be used. These treatments are associated with thinning of the skin, blood vessels that get larger (dilated), loss of skin pigmentation, and easy bruising. It is important to use these steroids as directed by your caregiver. Only treat the affected areas and not the normal, unaffected skin. People on long-term steroid treatment should wear a medical alert bracelet. Injections may be used in areas that are difficult to treat.  Scalp treatments are available as shampoos, solutions, sprays, foams, and oils. Avoid scratching the scalp and picking at the scales.  Anthralin medicine works well on areas that are difficult to treat. However, it stains clothes and skin and may cause temporary irritation.  Synthetic vitamin D (calcipotriene)can be used on  small areas. It is available by prescription. The forms of synthetic vitamin D available in health food stores do not  help with psoriasis.  Coal tarsare available in various strengths for psoriasis that is difficult to treat. They are one of the longest used treatments for difficult to treat psoriasis. However, they are messy to use.  Light therapy (UV therapy) can be carefully and professionally monitored in a dermatologist's office. Careful sunbathing is helpful for many people as directed by your caregiver. The exposure should be just long enough to cause a mild redness (erythema) of your skin. Avoid sunburn as this may make the condition worse. Sunscreen (SPF of 30 or higher) should be used to protect against sunburn. Cataracts, wrinkles, and skin aging are some of the harmful side effects of light therapy.  If creams (topical medicines) fail, there are several other options for systemic or oral medicines your caregiver can suggest. Psoriasis can sometimes be very difficult to treat. It can come and go. It is necessary to follow up with your caregiver regularly if your psoriasis is difficult to treat. Usually, with persistence you can get a good amount of relief. Maintaining consistent care is important. Do not change caregivers just because you do not see immediate results. It may take several trials to find the right combination of treatment for you. PREVENTING FLARE-UPS  Wear gloves while you wash dishes, while cleaning, and when you are outside in the cold.  If you have radiators, place a bowl of water or damp towel on the radiator. This will help put water back in the air. You can also use a humidifier to keep the air moist. Try to keep the humidity at about 60% in your home.  Apply moisturizer while your skin is still damp from bathing or showering. This traps water in the skin.  Avoid long, hot baths or showers. Keep soap use to a minimum. Soaps dry out the skin and wash away the protective oils. Use a fragrance free, dye free soap.  Drink enough water and fluids to keep your urine clear or pale  yellow. Not drinking enough water depletes your skin's water supply.  Turn off the heat at night and keep it low during the day. Cool air is less drying. SEEK MEDICAL CARE IF:  You have increasing pain in the affected areas.  You have uncontrolled bleeding in the affected areas.  You have increasing redness or warmth in the affected areas.  You start to have pain or stiffness in your joints.  You start feeling depressed about your condition.  You have a fever. Document Released: 02/28/2000 Document Revised: 05/25/2011 Document Reviewed: 08/25/2010 [facility] Patient Information 2015 [place], [place]. This information is not intended to replace advice given to you by your health care provider. Make sure you discuss any questions you have with your health care provider.  Smoking Cessation Quitting smoking is important to your health and has many advantages. However, it is not always easy to quit since nicotine is a very addictive drug. Oftentimes, people try 3 times or more before being able to quit. This document explains the best ways for you to prepare to quit smoking. Quitting takes hard work and a lot of effort, but you can do it. ADVANTAGES OF QUITTING SMOKING  You will live longer, feel better, and live better.  Your body will feel the impact of quitting smoking almost immediately.  Within 20 minutes, blood pressure decreases. Your pulse returns to its normal level.  After 8  hours, carbon monoxide levels in the blood return to normal. Your oxygen level increases.  After 24 hours, the chance of having a heart attack starts to decrease. Your breath, hair, and body stop smelling like smoke.  After 48 hours, damaged nerve endings begin to recover. Your sense of taste and smell improve.  After 72 hours, the body is virtually free of nicotine. Your bronchial tubes relax and breathing becomes easier.  After 2 to 12 weeks, lungs can hold more air. Exercise becomes easier and  circulation improves.  The risk of having a heart attack, stroke, cancer, or lung disease is greatly reduced.  After 1 year, the risk of coronary heart disease is cut in half.  After 5 years, the risk of stroke falls to the same as a nonsmoker.  After 10 years, the risk of lung cancer is cut in half and the risk of other cancers decreases significantly.  After 15 years, the risk of coronary heart disease drops, usually to the level of a nonsmoker.  If you are pregnant, quitting smoking will improve your chances of having a healthy baby.  The people you live with, especially any children, will be healthier.  You will have extra money to spend on things other than cigarettes. QUESTIONS TO THINK ABOUT BEFORE ATTEMPTING TO QUIT You may want to talk about your answers with your health care provider.  Why do you want to quit?  If you tried to quit in the past, what helped and what did not?  What will be the most difficult situations for you after you quit? How will you plan to handle them?  Who can help you through the tough times? Your family? Friends? A health care provider?  What pleasures do you get from smoking? What ways can you still get pleasure if you quit? Here are some questions to ask your health care provider:  How can you help me to be successful at quitting?  What medicine do you think would be best for me and how should I take it?  What should I do if I need more help?  What is smoking withdrawal like? How can I get information on withdrawal? GET READY  Set a quit date.  Change your environment by getting rid of all cigarettes, ashtrays, matches, and lighters in your home, car, or work. Do not let people smoke in your home.  Review your past attempts to quit. Think about what worked and what did not. GET SUPPORT AND ENCOURAGEMENT You have a better chance of being successful if you have help. You can get support in many ways.  Tell your family, friends, and  coworkers that you are going to quit and need their support. Ask them not to smoke around you.  Get individual, group, or telephone counseling and support. Programs are available at [facility] and health centers. Call your local health department for information about programs in your area.  Spiritual beliefs and practices may help some smokers quit.  Download a "quit meter" on your computer to keep track of quit statistics, such as how long you have gone without smoking, cigarettes not smoked, and money saved.  Get a self-help book about quitting smoking and staying off tobacco. LEARN NEW SKILLS AND BEHAVIORS  Distract yourself from urges to smoke. Talk to someone, go for a walk, or occupy your time with a task.  Change your normal routine. Take a different route to work. Drink tea instead of coffee. Eat breakfast in a different  place.  Reduce your stress. Take a hot bath, exercise, or read a book.  Plan something enjoyable to do every day. Reward yourself for not smoking.  Explore interactive web-based programs that specialize in helping you quit. GET MEDICINE AND USE IT CORRECTLY Medicines can help you stop smoking and decrease the urge to smoke. Combining medicine with the above behavioral methods and support can greatly increase your chances of successfully quitting smoking.  Nicotine replacement therapy helps deliver nicotine to your body without the negative effects and risks of smoking. Nicotine replacement therapy includes nicotine gum, lozenges, inhalers, nasal sprays, and skin patches. Some may be available over-the-counter and others require a prescription.  Antidepressant medicine helps people abstain from smoking, but how this works is unknown. This medicine is available by prescription.  Nicotinic receptor partial agonist medicine simulates the effect of nicotine in your brain. This medicine is available by prescription. Ask your health care provider for advice about  which medicines to use and how to use them based on your health history. Your health care provider will tell you what side effects to look out for if you choose to be on a medicine or therapy. Carefully read the information on the package. Do not use any other product containing nicotine while using a nicotine replacement product.  RELAPSE OR DIFFICULT SITUATIONS Most relapses occur within the first 3 months after quitting. Do not be discouraged if you start smoking again. Remember, most people try several times before finally quitting. You may have symptoms of withdrawal because your body is used to nicotine. You may crave cigarettes, be irritable, feel very hungry, cough often, get headaches, or have difficulty concentrating. The withdrawal symptoms are only temporary. They are strongest when you first quit, but they will go away within 10-14 days. To reduce the chances of relapse, try to:  Avoid drinking alcohol. Drinking lowers your chances of successfully quitting.  Reduce the amount of caffeine you consume. Once you quit smoking, the amount of caffeine in your body increases and can give you symptoms, such as a rapid heartbeat, sweating, and anxiety.  Avoid smokers because they can make you want to smoke.  Do not let weight gain distract you. Many smokers will gain weight when they quit, usually less than 10 pounds. Eat a healthy diet and stay active. You can always lose the weight gained after you quit.  Find ways to improve your mood other than smoking. FOR MORE INFORMATION  www.smokefree.gov  Document Released: 02/24/2001 Document Revised: 07/17/2013 Document Reviewed: 06/11/2011 [facility] Patient Information 2015 [place], [place]. This information is not intended to replace advice given to you by your health care provider. Make sure you discuss any questions you have with your health care provider.  Varenicline oral tablets What is this medicine? VARENICLINE (var EN i kleen) is used to  help people quit smoking. It can reduce the symptoms caused by stopping smoking. It is used with a patient support program recommended by your physician. This medicine may be used for other purposes; ask your health care provider or pharmacist if you have questions. COMMON BRAND NAME(S): Chantix What should I tell my health care provider before I take this medicine? They need to know if you have any of these conditions: -bipolar disorder, depression, schizophrenia or other mental illness -heart disease -if you often drink alcohol -kidney disease -peripheral vascular disease -seizures -stroke -suicidal thoughts, plans, or attempt; a previous suicide attempt by you or a family member -an unusual or allergic reaction to  varenicline, other medicines, foods, dyes, or preservatives -pregnant or trying to get pregnant -breast-feeding How should I use this medicine? You should set a date to stop smoking and tell your doctor. Start this medicine one week before the quit date. You can also start taking this medicine before you choose a quit date, and then pick a quit date that is between 8 and 35 days of treatment with this medicine. Stick to your plan; ask about support groups or other ways to help you remain a 'quitter'. Take this medicine by mouth after eating. Take with a full glass of water. Follow the directions on the prescription label. Take your doses at regular intervals. Do not take your medicine more often than directed. A special MedGuide will be given to you by the pharmacist with each prescription and refill. Be sure to read this information carefully each time. Talk to your pediatrician regarding the use of this medicine in children. This medicine is not approved for use in children. Overdosage: If you think you have taken too much of this medicine contact a poison control center or emergency room at once. NOTE: This medicine is only for you. Do not share this medicine with others. What  if I miss a dose? If you miss a dose, take it as soon as you can. If it is almost time for your next dose, take only that dose. Do not take double or extra doses. What may interact with this medicine? -alcohol or any product that contains alcohol -insulin -other stop smoking aids -theophylline -warfarin This list may not describe all possible interactions. Give your health care provider a list of all the medicines, herbs, non-prescription drugs, or dietary supplements you use. Also tell them if you smoke, drink alcohol, or use illegal drugs. Some items may interact with your medicine. What should I watch for while using this medicine? Visit your doctor or health care professional for regular check ups. Ask for ongoing advice and encouragement from your doctor or healthcare professional, friends, and family to help you quit. If you smoke while on this medication, quit again Your mouth may get dry. Chewing sugarless gum or sucking hard candy, and drinking plenty of water may help. Contact your doctor if the problem does not go away or is severe. You may get drowsy or dizzy. Do not drive, use machinery, or do anything that needs mental alertness until you know how this medicine affects you. Do not stand or sit up quickly, especially if you are an older patient. This reduces the risk of dizzy or fainting spells. The use of this medicine may increase the chance of suicidal thoughts or actions. Pay special attention to how you are responding while on this medicine. Any worsening of mood, or thoughts of suicide or dying should be reported to your health care professional right away. What side effects may I notice from receiving this medicine? Side effects that you should report to your doctor or health care professional as soon as possible: -allergic reactions like skin rash, itching or hives, swelling of the face, lips, tongue, or throat -breathing problems -changes in vision -chest pain or chest  tightness -confusion, trouble speaking or understanding -fast, irregular heartbeat -feeling faint or lightheaded, falls -fever -pain in legs when walking -problems with balance, talking, walking -redness, blistering, peeling or loosening of the skin, including inside the mouth -ringing in ears -seizures -sudden numbness or weakness of the face, arm or leg -suicidal thoughts or other mood changes -trouble passing urine  or change in the amount of urine -unusual bleeding or bruising -unusually weak or tired Side effects that usually do not require medical attention (report to your doctor or health care professional if they continue or are bothersome): -constipation -headache -nausea, vomiting -strange dreams -stomach gas -trouble sleeping This list may not describe all possible side effects. Call your doctor for medical advice about side effects. You may report side effects to FDA at 1-800-FDA-1088. Where should I keep my medicine? Keep out of the reach of children. Store at room temperature between 15 and 30 degrees C (59 and 86 degrees F). Throw away any unused medicine after the expiration date. NOTE: This sheet is a summary. It may not cover all possible information. If you have questions about this medicine, talk to your doctor, pharmacist, or health care provider.  2015, [employer] Standard. (2012-12-12 13:37:47)   Bupropion sustained-release tablets (smoking cessation) What is this medicine? BUPROPION (byoo PROE pee on) is used to help people quit smoking. This medicine may be used for other purposes; ask your health care provider or pharmacist if you have questions. COMMON BRAND NAME(S): Buproban, Zyban What should I tell my health care provider before I take this medicine? They need to know if you have any of these conditions: -an eating disorder, such as anorexia or bulimia -bipolar disorder or psychosis -diabetes or high blood sugar, treated with  medication -glaucoma -head injury or brain tumor -heart disease, previous heart attack, or irregular heart beat -high blood pressure -kidney or liver disease -seizures -suicidal thoughts or a previous suicide attempt -Tourette's syndrome -weight loss -an unusual or allergic reaction to bupropion, other medicines, foods, dyes, or preservatives -breast-feeding -pregnant or trying to become pregnant How should I use this medicine? Take this medicine by mouth with a glass of water. Follow the directions on the prescription label. You can take it with or without food. If it upsets your stomach, take it with food. Do not cut, crush or chew this medicine. Take your medicine at regular intervals. If you take this medicine more than once a day, take your second dose at least 8 hours after you take your first dose. To limit difficulty in sleeping, avoid taking this medicine at bedtime. Do not take your medicine more often than directed. Do not stop taking this medicine suddenly except upon the advice of your doctor. Stopping this medicine too quickly may cause serious side effects. A special MedGuide will be given to you by the pharmacist with each prescription and refill. Be sure to read this information carefully each time. Talk to your pediatrician regarding the use of this medicine in children. Special care may be needed. Overdosage: If you think you have taken too much of this medicine contact a poison control center or emergency room at once. NOTE: This medicine is only for you. Do not share this medicine with others. What if I miss a dose? If you miss a dose, skip the missed dose and take your next tablet at the regular time. There should be at least 8 hours between doses. Do not take double or extra doses. What may interact with this medicine? Do not take this medicine with any of the following medications: -linezolid -MAOIs like Azilect, Carbex, Eldepryl, Marplan, Nardil, and  Parnate -methylene blue (injected into a vein) -other medicines that contain bupropion like Wellbutrin This medicine may also interact with the following medications: -alcohol -certain medicines for anxiety or sleep -certain medicines for blood pressure like metoprolol, propranolol -certain medicines  for depression or psychotic disturbances -certain medicines for HIV or AIDS like efavirenz, lopinavir, nelfinavir, ritonavir -certain medicines for irregular heart beat like propafenone, flecainide -certain medicines for Parkinson's disease like amantadine, levodopa -certain medicines for seizures like carbamazepine, phenytoin, phenobarbital -cimetidine -clopidogrel -cyclophosphamide -furazolidone -isoniazid -nicotine -orphenadrine -procarbazine -steroid medicines like prednisone or cortisone -stimulant medicines for attention disorders, weight loss, or to stay awake -tamoxifen -theophylline -thiotepa -ticlopidine -tramadol -warfarin This list may not describe all possible interactions. Give your health care provider a list of all the medicines, herbs, non-prescription drugs, or dietary supplements you use. Also tell them if you smoke, drink alcohol, or use illegal drugs. Some items may interact with your medicine. What should I watch for while using this medicine? Visit your doctor or health care professional for regular checks on your progress. This medicine should be used together with a patient support program. It is important to participate in a behavioral program, counseling, or other support program that is recommended by your health care professional. Patients and their families should watch out for new or worsening thoughts of suicide or depression. Also watch out for sudden changes in feelings such as feeling anxious, agitated, panicky, irritable, hostile, aggressive, impulsive, severely restless, overly excited and hyperactive, or not being able to sleep. If this happens,  especially at the beginning of treatment or after a change in dose, call your health care professional. Avoid alcoholic drinks while taking this medicine. Drinking excessive alcoholic beverages, using sleeping or anxiety medicines, or quickly stopping the use of these agents while taking this medicine may increase your risk for a seizure. Do not drive or use heavy machinery until you know how this medicine affects you. This medicine can impair your ability to perform these tasks. Do not take this medicine close to bedtime. It may prevent you from sleeping. Your mouth may get dry. Chewing sugarless gum or sucking hard candy, and drinking plenty of water may help. Contact your doctor if the problem does not go away or is severe. Do not use nicotine patches or chewing gum without the advice of your doctor or health care professional while taking this medicine. You may need to have your blood pressure taken regularly if your doctor recommends that you use both nicotine and this medicine together. What side effects may I notice from receiving this medicine? Side effects that you should report to your doctor or health care professional as soon as possible: -allergic reactions like skin rash, itching or hives, swelling of the face, lips, or tongue -breathing problems -changes in vision -confusion -fast or irregular heartbeat -hallucinations -increased blood pressure -redness, blistering, peeling or loosening of the skin, including inside the mouth -seizures -suicidal thoughts or other mood changes -unusually weak or tired -vomiting Side effects that usually do not require medical attention (report to your doctor or health care professional if they continue or are bothersome): -change in sex drive or performance -constipation -headache -loss of appetite -nausea -tremors -weight loss This list may not describe all possible side effects. Call your doctor for medical advice about side effects. You  may report side effects to FDA at 1-800-FDA-1088. Where should I keep my medicine? Keep out of the reach of children. Store at room temperature between 20 and 25 degrees C (68 and 77 degrees F). Protect from light. Keep container tightly closed. Throw away any unused medicine after the expiration date. NOTE: This sheet is a summary. It may not cover all possible information. If you have questions about this  medicine, talk to your doctor, pharmacist, or health care provider.  2015, Elsevier/Gold Standard. (2012-10-28 10:55:10)

## 2014-04-24 NOTE — Assessment & Plan Note (Signed)
Stable with current treatment regimen. Continue over-the-counter medications as needed.

## 2014-04-24 NOTE — Assessment & Plan Note (Signed)
Discussed with patient options for quitting smoking including gums, patches, Chantix, and Zyban. Patient indicates she would like to research the material first. Information provided to patient and will pursue treatment course of her choice.

## 2014-04-25 ENCOUNTER — Telehealth: Payer: Self-pay | Admitting: Family

## 2014-04-25 NOTE — Telephone Encounter (Signed)
emmi mailed  °

## 2014-04-27 ENCOUNTER — Other Ambulatory Visit: Payer: Self-pay | Admitting: Advanced Practice Midwife

## 2014-05-04 ENCOUNTER — Ambulatory Visit (INDEPENDENT_AMBULATORY_CARE_PROVIDER_SITE_OTHER): Payer: 59 | Admitting: Family

## 2014-05-04 ENCOUNTER — Encounter: Payer: Self-pay | Admitting: Family

## 2014-05-04 ENCOUNTER — Encounter: Payer: 59 | Admitting: Family

## 2014-05-04 VITALS — BP 112/82 | HR 84 | Temp 98.3°F | Resp 18 | Wt 199.0 lb

## 2014-05-04 DIAGNOSIS — F17200 Nicotine dependence, unspecified, uncomplicated: Secondary | ICD-10-CM

## 2014-05-04 DIAGNOSIS — Z0000 Encounter for general adult medical examination without abnormal findings: Secondary | ICD-10-CM | POA: Insufficient documentation

## 2014-05-04 DIAGNOSIS — Z720 Tobacco use: Secondary | ICD-10-CM

## 2014-05-04 MED ORDER — BUPROPION HCL ER (SMOKING DET) 150 MG PO TB12: ORAL_TABLET | ORAL | Status: DC

## 2014-05-04 NOTE — Progress Notes (Signed)
Pre visit review using our clinic review tool, if applicable. No additional management support is needed unless otherwise documented below in the visit note. 

## 2014-05-04 NOTE — Progress Notes (Signed)
Subjective:    Patient ID: Breanna Garcia, female    DOB: 03/14/1981, 34 y.o.   MRN: [id number]  Chief Complaint  Patient presents with  . CPE    not fasting    HPI:  Breanna Garcia is a 34 y.o. female who presents today for an annual wellness visit.   1) Health Maintenance -   Diet - Eating 2-3 meals per day; Limits fried and processed food; a lot of caffeine   Exercise - No structured exercise   2) Preventative Exams / Immunizations:  Dental -- Up to date   Vision -- Due for exam    Health Maintenance  Topic Date Due  . INFLUENZA VACCINE  05/05/2015 (Originally 10/14/2013)  . PAP SMEAR  05/01/2017  . TETANUS/TDAP  07/07/2023     Immunization History  Administered Date(s) Administered  . Tdap 07/06/2013   Allergies  Allergen Reactions  . Penicillins Hives    Current Outpatient Prescriptions on File Prior to Visit  Medication Sig Dispense Refill  . betamethasone valerate ointment (VALISONE) 0.1 % Apply 1 application topically 2 (two) times daily. 30 g 0  . LORazepam (ATIVAN) 1 MG tablet Take 1 mg by mouth every 8 (eight) hours.    . norethindrone (MICRONOR,CAMILA,ERRIN) 0.35 MG tablet Take 1 tablet by mouth daily.     No current facility-administered medications on file prior to visit.    Past Medical History  Diagnosis Date  . Chronic back pain   . Abscess   . Anxiety     Past Surgical History  Procedure Laterality Date  . Tonsillectomy and adenoidectomy    . Wisdom tooth extraction      Family History  Problem Relation Age of Onset  . Hypothyroidism Mother   . Stroke Maternal Grandmother   . Hypertension Maternal Grandfather   . Heart disease Maternal Grandfather     History   Social History  . Marital Status: Single    Spouse Name: N/A  . Number of Children: 0  . Years of Education: 15   Occupational History  . Manager of [employer]    Social History Main Topics  . Smoking status: Current Every Day Smoker -- 0.50 packs/day  for 20 years    Types: Cigarettes  . Smokeless tobacco: Never Used  . Alcohol Use: Yes     Comment: occasionally  . Drug Use: No  . Sexual Activity: Yes    Birth Control/ Protection: Pill   Other Topics Concern  . Not on file   Social History Narrative   Born and raised in [place], [place]. Currently resides in a house with her mother. 5 dogs. Fun: Works all the time.    Denies religious beliefs that would effect health care.     Review of Systems  Constitutional: Denies fever, chills, fatigue, or significant weight gain/loss. HENT: Head: Denies headache or neck pain Ears: Denies changes in hearing, ringing in ears, earache, drainage Nose: Denies discharge, stuffiness, itching, nosebleed, sinus pain Throat: Denies sore throat, hoarseness, dry mouth, sores, thrush Eyes: Denies loss/changes in vision, pain, redness, blurry/double vision, flashing lights Cardiovascular: Denies chest pain/discomfort, tightness, palpitations, shortness of breath with activity, difficulty lying down, swelling, sudden awakening with shortness of breath Respiratory: Denies shortness of breath, cough, sputum production, wheezing Gastrointestinal: Denies dysphasia, heartburn, change in appetite, nausea, change in bowel habits, rectal bleeding, constipation, diarrhea, yellow skin or eyes Genitourinary: Denies frequency, urgency, burning/pain, blood in urine, incontinence, change in urinary strength. Musculoskeletal:  Denies muscle/joint pain, stiffness, back pain, redness or swelling of joints, trauma Skin: Denies rashes, lumps, itching, dryness, color changes, or hair/nail changes Neurological: Denies dizziness, fainting, seizures, weakness, numbness, tingling, tremor Psychiatric - Denies nervousness, stress, depression or memory loss Endocrine: Denies heat or cold intolerance, sweating, frequent urination, excessive thirst, changes in appetite Hematologic: Denies ease of bruising or bleeding     Objective:     BP 112/82 mmHg  Pulse 84  Temp(Src) 98.3 F (36.8 C) (Oral)  Resp 18  Wt 199 lb (90.266 kg)  SpO2 99% Nursing note and vital signs reviewed.  Physical Exam  Constitutional: She is oriented to person, place, and time. She appears well-developed and well-nourished.  HENT:  Head: Normocephalic.  Right Ear: Hearing, tympanic membrane, external ear and ear canal normal.  Left Ear: Hearing, tympanic membrane, external ear and ear canal normal.  Nose: Nose normal.  Mouth/Throat: Uvula is midline, oropharynx is clear and moist and mucous membranes are normal.  Eyes: Conjunctivae and EOM are normal. Pupils are equal, round, and reactive to light.  Neck: Neck supple. No JVD present. No tracheal deviation present. No thyromegaly present.  Cardiovascular: Normal rate, regular rhythm, normal heart sounds and intact distal pulses.   Pulmonary/Chest: Effort normal and breath sounds normal.  Abdominal: Soft. Bowel sounds are normal. She exhibits no distension and no mass. There is no tenderness. There is no rebound and no guarding.  Musculoskeletal: Normal range of motion. She exhibits no edema or tenderness.  Lymphadenopathy:    She has no cervical adenopathy.  Neurological: She is alert and oriented to person, place, and time. She has normal reflexes. No cranial nerve deficit. She exhibits normal muscle tone. Coordination normal.  Skin: Skin is warm and dry.  Psychiatric: She has a normal mood and affect. Her behavior is normal. Judgment and thought content normal.       Assessment & Plan:

## 2014-05-04 NOTE — Patient Instructions (Signed)
Thank you for choosing [facility].  Summary/Instructions:  Your prescription(s) have been submitted to your pharmacy or been printed and provided for you. Please take as directed and contact our office if you believe you are having problem(s) with the medication(s) or have any questions.  Please stop by the lab on the basement level of the building for your blood work. Your results will be released to MyChart (or called to you) after review, usually within 72 hours after test completion. If any changes need to be made, you will be notified at that same time.  Health Maintenance Adopting a healthy lifestyle and getting preventive care can go a long way to promote health and wellness. Talk with your health care provider about what schedule of regular examinations is right for you. This is a good chance for you to check in with your provider about disease prevention and staying healthy. In between checkups, there are plenty of things you can do on your own. Experts have done a lot of research about which lifestyle changes and preventive measures are most likely to keep you healthy. Ask your health care provider for more information. WEIGHT AND DIET  Eat a healthy diet  Be sure to include plenty of vegetables, fruits, low-fat dairy products, and lean protein.  Do not eat a lot of foods high in solid fats, added sugars, or salt.  Get regular exercise. This is one of the most important things you can do for your health.  Most adults should exercise for at least 150 minutes each week. The exercise should increase your heart rate and make you sweat (moderate-intensity exercise).  Most adults should also do strengthening exercises at least twice a week. This is in addition to the moderate-intensity exercise.  Maintain a healthy weight  Body mass index (BMI) is a measurement that can be used to identify possible weight problems. It estimates body fat based on height and weight. Your health  care provider can help determine your BMI and help you achieve or maintain a healthy weight.  For females 20 years of age and older:   A BMI below 18.5 is considered underweight.  A BMI of 18.5 to 24.9 is normal.  A BMI of 25 to 29.9 is considered overweight.  A BMI of 30 and above is considered obese.  Watch levels of cholesterol and blood lipids  You should start having your blood tested for lipids and cholesterol at 36 years of age, then have this test every 5 years.  You may need to have your cholesterol levels checked more often if:  Your lipid or cholesterol levels are high.  You are older than 36 years of age.  You are at high risk for heart disease.  CANCER SCREENING   Lung Cancer  Lung cancer screening is recommended for adults 55-80 years old who are at high risk for lung cancer because of a history of smoking.  A yearly low-dose CT scan of the lungs is recommended for people who:  Currently smoke.  Have quit within the past 15 years.  Have at least a 30-pack-year history of smoking. A pack year is smoking an average of one pack of cigarettes a day for 1 year.  Yearly screening should continue until it has been 15 years since you quit.  Yearly screening should stop if you develop a health problem that would prevent you from having lung cancer treatment.  Breast Cancer  Practice breast self-awareness. This means understanding how your breasts normally appear   and feel.  It also means doing regular breast self-exams. Let your health care provider know about any changes, no matter how small.  If you are in your 20s or 30s, you should have a clinical breast exam (CBE) by a health care provider every 1-3 years as part of a regular health exam.  If you are 40 or older, have a CBE every year. Also consider having a breast X-ray (mammogram) every year.  If you have a family history of breast cancer, talk to your health care provider about genetic  screening.  If you are at high risk for breast cancer, talk to your health care provider about having an MRI and a mammogram every year.  Breast cancer gene (BRCA) assessment is recommended for women who have family members with BRCA-related cancers. BRCA-related cancers include:  Breast.  Ovarian.  Tubal.  Peritoneal cancers.  Results of the assessment will determine the need for genetic counseling and BRCA1 and BRCA2 testing. Cervical Cancer Routine pelvic examinations to screen for cervical cancer are no longer recommended for nonpregnant women who are considered low risk for cancer of the pelvic organs (ovaries, uterus, and vagina) and who do not have symptoms. A pelvic examination may be necessary if you have symptoms including those associated with pelvic infections. Ask your health care provider if a screening pelvic exam is right for you.   The Pap test is the screening test for cervical cancer for women who are considered at risk.  If you had a hysterectomy for a problem that was not cancer or a condition that could lead to cancer, then you no longer need Pap tests.  If you are older than 65 years, and you have had normal Pap tests for the past 10 years, you no longer need to have Pap tests.  If you have had past treatment for cervical cancer or a condition that could lead to cancer, you need Pap tests and screening for cancer for at least 20 years after your treatment.  If you no longer get a Pap test, assess your risk factors if they change (such as having a new sexual partner). This can affect whether you should start being screened again.  Some women have medical problems that increase their chance of getting cervical cancer. If this is the case for you, your health care provider may recommend more frequent screening and Pap tests.  The human papillomavirus (HPV) test is another test that may be used for cervical cancer screening. The HPV test looks for the virus that can  cause cell changes in the cervix. The cells collected during the Pap test can be tested for HPV.  The HPV test can be used to screen women 30 years of age and older. Getting tested for HPV can extend the interval between normal Pap tests from three to five years.  An HPV test also should be used to screen women of any age who have unclear Pap test results.  After 36 years of age, women should have HPV testing as often as Pap tests.  Colorectal Cancer  This type of cancer can be detected and often prevented.  Routine colorectal cancer screening usually begins at 36 years of age and continues through 36 years of age.  Your health care provider may recommend screening at an earlier age if you have risk factors for colon cancer.  Your health care provider may also recommend using home test kits to check for hidden blood in the stool.  A small   camera at the end of a tube can be used to examine your colon directly (sigmoidoscopy or colonoscopy). This is done to check for the earliest forms of colorectal cancer.  Routine screening usually begins at age 47.  Direct examination of the colon should be repeated every 5-10 years through 36 years of age. However, you may need to be screened more often if early forms of precancerous polyps or small growths are found. Skin Cancer  Check your skin from head to toe regularly.  Tell your health care provider about any new moles or changes in moles, especially if there is a change in a mole's shape or color.  Also tell your health care provider if you have a mole that is larger than the size of a pencil eraser.  Always use sunscreen. Apply sunscreen liberally and repeatedly throughout the day.  Protect yourself by wearing long sleeves, pants, a wide-brimmed hat, and sunglasses whenever you are outside. HEART DISEASE, DIABETES, AND HIGH BLOOD PRESSURE   Have your blood pressure checked at least every 1-2 years. High blood pressure causes heart  disease and increases the risk of stroke.  If you are between 61 years and 67 years old, ask your health care provider if you should take aspirin to prevent strokes.  Have regular diabetes screenings. This involves taking a blood sample to check your fasting blood sugar level.  If you are at a normal weight and have a low risk for diabetes, have this test once every three years after 36 years of age.  If you are overweight and have a high risk for diabetes, consider being tested at a younger age or more often. PREVENTING INFECTION  Hepatitis B  If you have a higher risk for hepatitis B, you should be screened for this virus. You are considered at high risk for hepatitis B if:  You were born in a country where hepatitis B is common. Ask your health care provider which countries are considered high risk.  Your parents were born in a high-risk country, and you have not been immunized against hepatitis B (hepatitis B vaccine).  You have HIV or AIDS.  You use needles to inject street drugs.  You live with someone who has hepatitis B.  You have had sex with someone who has hepatitis B.  You get hemodialysis treatment.  You take certain medicines for conditions, including cancer, organ transplantation, and autoimmune conditions. Hepatitis C  Blood testing is recommended for:  Everyone born from [birth year] through 1967.  Anyone with known risk factors for hepatitis C. Sexually transmitted infections (STIs)  You should be screened for sexually transmitted infections (STIs) including gonorrhea and chlamydia if:  You are sexually active and are younger than 35 years of age.  You are older than 36 years of age and your health care provider tells you that you are at risk for this type of infection.  Your sexual activity has changed since you were last screened and you are at an increased risk for chlamydia or gonorrhea. Ask your health care provider if you are at risk.  If you do not have  HIV, but are at risk, it may be recommended that you take a prescription medicine daily to prevent HIV infection. This is called pre-exposure prophylaxis (PrEP). You are considered at risk if:  You are sexually active and do not regularly use condoms or know the HIV status of your partner(s).  You take drugs by injection.  You are sexually active with a  partner who has HIV. Talk with your health care provider about whether you are at high risk of being infected with HIV. If you choose to begin PrEP, you should first be tested for HIV. You should then be tested every 3 months for as long as you are taking PrEP.  PREGNANCY   If you are premenopausal and you may become pregnant, ask your health care provider about preconception counseling.  If you may become pregnant, take 400 to 800 micrograms (mcg) of folic acid every day.  If you want to prevent pregnancy, talk to your health care provider about birth control (contraception). OSTEOPOROSIS AND MENOPAUSE   Osteoporosis is a disease in which the bones lose minerals and strength with aging. This can result in serious bone fractures. Your risk for osteoporosis can be identified using a bone density scan.  If you are 65 years of age or older, or if you are at risk for osteoporosis and fractures, ask your health care provider if you should be screened.  Ask your health care provider whether you should take a calcium or vitamin D supplement to lower your risk for osteoporosis.  Menopause may have certain physical symptoms and risks.  Hormone replacement therapy may reduce some of these symptoms and risks. Talk to your health care provider about whether hormone replacement therapy is right for you.  HOME CARE INSTRUCTIONS   Schedule regular health, dental, and eye exams.  Stay current with your immunizations.   Do not use any tobacco products including cigarettes, chewing tobacco, or electronic cigarettes.  If you are pregnant, do not  drink alcohol.  If you are breastfeeding, limit how much and how often you drink alcohol.  Limit alcohol intake to no more than 1 drink per day for nonpregnant women. One drink equals 12 ounces of beer, 5 ounces of wine, or 1 ounces of hard liquor.  Do not use street drugs.  Do not share needles.  Ask your health care provider for help if you need support or information about quitting drugs.  Tell your health care provider if you often feel depressed.  Tell your health care provider if you have ever been abused or do not feel safe at home. Document Released: 09/15/2010 Document Revised: 07/17/2013 Document Reviewed: 02/01/2013 Exit[employer] Patient Information 2015 [employer]. This information is not intended to replace advice given to you by your health care provider. Make sure you discuss any questions you have with your health care provider.   

## 2014-05-04 NOTE — Assessment & Plan Note (Signed)
Patient indicates she is ready to quit smoking. Start Zyban. Discussed side effects and risks and benefits of the medication with the patient. She wishes to continue. Follow-up in one month or sooner if necessary.

## 2014-05-04 NOTE — Assessment & Plan Note (Signed)
1) Anticipatory Guidance: Discussed importance of wearing a seatbelt while driving and not texting while driving; changing batteries in smoke detector at least once annually; wearing suntan lotion when outside; eating a balanced and moderate diet; getting physical activity at least 30 minutes per day.  2) Immunizations / Screenings / Labs:  Patient declines flu shot. All other immunizations are up-to-date per recommendations. All screenings are up-to-date per recommendations. Obtain CBC, BMET, Lipid profile and TSH.   Overall well exam. Patient's risk factors include obesity and tobacco use. Discussed importance of decreasing saturated fat and processed food in her diet while increasing the nutrient density. Recommend increasing physical activity to 30 minutes most days a week. She is actively starting to quit smoking with Zyban starting today. Continue other healthy lifestyle behaviors. Follow up prevention exam in one year.

## 2014-06-07 ENCOUNTER — Telehealth: Payer: Self-pay

## 2014-06-07 NOTE — Telephone Encounter (Signed)
Called pt to get another pharmacy to try to send her Bupropion 150 mg. Spoke with [facility] and they are unable to fill that medication.

## 2014-07-16 ENCOUNTER — Encounter (HOSPITAL_COMMUNITY): Payer: Self-pay | Admitting: Emergency Medicine

## 2014-07-16 ENCOUNTER — Emergency Department (HOSPITAL_COMMUNITY)
Admission: EM | Admit: 2014-07-16 | Discharge: 2014-07-16 | Disposition: A | Discharge status: Home / Self Care | Payer: 59 | Attending: Emergency Medicine | Admitting: Emergency Medicine

## 2014-07-16 ENCOUNTER — Emergency Department (HOSPITAL_COMMUNITY): Payer: 59

## 2014-07-16 DIAGNOSIS — IMO0002 Reserved for concepts with insufficient information to code with codable children: Secondary | ICD-10-CM

## 2014-07-16 DIAGNOSIS — Z720 Tobacco use: Secondary | ICD-10-CM | POA: Diagnosis not present

## 2014-07-16 DIAGNOSIS — Y9289 Other specified places as the place of occurrence of the external cause: Secondary | ICD-10-CM | POA: Diagnosis not present

## 2014-07-16 DIAGNOSIS — Y9389 Activity, other specified: Secondary | ICD-10-CM | POA: Diagnosis not present

## 2014-07-16 DIAGNOSIS — W25XXXA Contact with sharp glass, initial encounter: Secondary | ICD-10-CM | POA: Diagnosis not present

## 2014-07-16 DIAGNOSIS — F419 Anxiety disorder, unspecified: Secondary | ICD-10-CM | POA: Insufficient documentation

## 2014-07-16 DIAGNOSIS — Z880 Allergy status to penicillin: Secondary | ICD-10-CM | POA: Diagnosis not present

## 2014-07-16 DIAGNOSIS — Z23 Encounter for immunization: Secondary | ICD-10-CM | POA: Diagnosis not present

## 2014-07-16 DIAGNOSIS — S51812A Laceration without foreign body of left forearm, initial encounter: Secondary | ICD-10-CM | POA: Diagnosis present

## 2014-07-16 DIAGNOSIS — Z79899 Other long term (current) drug therapy: Secondary | ICD-10-CM | POA: Insufficient documentation

## 2014-07-16 DIAGNOSIS — Z793 Long term (current) use of hormonal contraceptives: Secondary | ICD-10-CM | POA: Insufficient documentation

## 2014-07-16 DIAGNOSIS — Z872 Personal history of diseases of the skin and subcutaneous tissue: Secondary | ICD-10-CM | POA: Insufficient documentation

## 2014-07-16 DIAGNOSIS — Y998 Other external cause status: Secondary | ICD-10-CM | POA: Insufficient documentation

## 2014-07-16 DIAGNOSIS — G8929 Other chronic pain: Secondary | ICD-10-CM | POA: Diagnosis not present

## 2014-07-16 MED ORDER — TETANUS-DIPHTH-ACELL PERTUSSIS 5-2.5-18.5 LF-MCG/0.5 IM SUSP
0.5000 mL | Freq: Once | INTRAMUSCULAR | Status: AC
  Administered 2014-07-16: 0.5 mL via INTRAMUSCULAR
  Filled 2014-07-16: qty 0.5

## 2014-07-16 MED ORDER — LIDOCAINE HCL (PF) 1 % IJ SOLN
5.0000 mL | Freq: Once | INTRAMUSCULAR | Status: DC
  Filled 2014-07-16: qty 5

## 2014-07-16 MED ORDER — HYDROCODONE-ACETAMINOPHEN 5-325 MG PO TABS
1.0000 | ORAL_TABLET | Freq: Once | ORAL | Status: AC
  Administered 2014-07-16: 1 via ORAL
  Filled 2014-07-16: qty 1

## 2014-07-16 MED ORDER — TRAMADOL HCL 50 MG PO TABS: 50.0000 mg | ORAL_TABLET | Freq: Four times a day (QID) | ORAL | Status: DC | PRN

## 2014-07-16 MED ORDER — LIDOCAINE-EPINEPHRINE-TETRACAINE (LET) SOLUTION
3.0000 mL | Freq: Once | NASAL | Status: AC
  Administered 2014-07-16: 3 mL via TOPICAL
  Filled 2014-07-16: qty 3

## 2014-07-16 NOTE — ED Provider Notes (Signed)
CSN: [id number]     Arrival date & time 07/16/14  0102 History   First MD Initiated Contact with Patient 07/16/14 0111     Chief Complaint  Patient presents with  . Extremity Laceration    LLE     (Consider location/radiation/quality/duration/timing/severity/associated sxs/prior Treatment) HPI Pt is a 34yo female presenting to ED with c/o Left forearm laceration which occurred around 12:30AM.  Pt states she was putting dishes away when she accidentally dropped one while holding a glass in her Right hand, then accidentally smashed the glass in her Right hand into her Left forearm.  Pt states glass did shatter. No other injuries.  Pain is aching and burning, 7/10 at worst. No pain medication PTA. Pt is unsure of last tetanus shot.   Past Medical History  Diagnosis Date  . Chronic back pain   . Abscess   . Anxiety    Past Surgical History  Procedure Laterality Date  . Tonsillectomy and adenoidectomy    . Wisdom tooth extraction     Family History  Problem Relation Age of Onset  . Hypothyroidism Mother   . Stroke Maternal Grandmother   . Hypertension Maternal Grandfather   . Heart disease Maternal Grandfather    History  Substance Use Topics  . Smoking status: Current Every Day Smoker -- 0.50 packs/day for 20 years    Types: Cigarettes  . Smokeless tobacco: Never Used  . Alcohol Use: 0.0 oz/week    0 Standard drinks or equivalent per week     Comment: occasionally   OB History    No data available     Review of Systems  Musculoskeletal: Positive for myalgias. Negative for arthralgias.  Skin: Positive for wound. Negative for color change.  Neurological: Negative for weakness and numbness.  All other systems reviewed and are negative.     Allergies  Penicillins  Home Medications   Prior to Admission medications   Medication Sig Start Date End Date Taking? Authorizing Provider  betamethasone valerate ointment (VALISONE) 0.1 % Apply 1 application topically 2 (two)  times daily. 04/24/14  Yes Veryl Speak, FNP  LORazepam (ATIVAN) 1 MG tablet Take 1 mg by mouth every 8 (eight) hours as needed for anxiety.    Yes Historical Provider, MD  norethindrone (MICRONOR,CAMILA,ERRIN) 0.35 MG tablet Take 1 tablet by mouth daily.   Yes Historical Provider, MD  PRESCRIPTION MEDICATION Apply 1 application topically 2 (two) times daily. Sample given to patient from her doctor for  psoriasis ointment   Yes Historical Provider, MD  buPROPion (ZYBAN) 150 MG 12 hr tablet Take 1 tablet daily for 3 days then 1 tablet 2 times daily. Patient not taking: Reported on 07/16/2014 05/04/14   Veryl Speak, FNP  traMADol (ULTRAM) 50 MG tablet Take 1 tablet (50 mg total) by mouth every 6 (six) hours as needed. 07/16/14   Junius Finner, PA-C   BP 108/74 mmHg  Pulse 78  Temp(Src) 97.9 F (36.6 C) (Oral)  Resp 14  Ht  (1.575 m)  Wt 175 lb (79.379 kg)  BMI 32.00 kg/m2  SpO2 100%  LMP 07/02/2014 (Approximate) Physical Exam  Constitutional: She is oriented to person, place, and time. She appears well-developed and well-nourished.  HENT:  Head: Normocephalic and atraumatic.  Eyes: EOM are normal.  Neck: Normal range of motion.  Cardiovascular: Normal rate.   Pulses:      Radial pulses are 2+ on the left side.  Pulmonary/Chest: Effort normal.  Musculoskeletal: Normal range of  motion.  Neurological: She is alert and oriented to person, place, and time.  Left arm and hand: sensation to light and sharp touch in tact.  Skin: Skin is warm and dry. Laceration noted.  4cm triangular shaped laceration exposing adipose tissue on volar aspect of Left forearm.   Psychiatric: She has a normal mood and affect. Her behavior is normal.  Nursing note and vitals reviewed.   ED Course  Procedures   LACERATION REPAIR Performed by: Junius Finner'MALLEY, Keylor Rands A. Authorized by: Ina Homes'MALLEY, Timaya Bojarski A. Consent: Verbal consent obtained. Risks and benefits: risks, benefits and alternatives were  discussed Consent given by: patient Patient identity confirmed: provided demographic data Prepped and Draped in normal sterile fashion Wound explored  Laceration Location: Left forearm  Laceration Length: 4cm  No Foreign Bodies seen or palpated  Anesthesia: topical and local infiltration  Local anesthetic: LET, lidocaine 1% w/o epi  Anesthetic total: 1.5 ml  Irrigation method: syringe Amount of cleaning: standard  Skin closure: 4-0 prolene  Number of sutures: 5  Technique: interrupted   Patient tolerance: Patient tolerated the procedure well with no immediate complications.   Labs Review Labs Reviewed - No data to display  Imaging Review Dg Forearm Left  07/16/2014   CLINICAL DATA:  Laceration to distal forearm.  Glass injury  EXAM: LEFT FOREARM - 2 VIEW  COMPARISON:  None.  FINDINGS: There is a soft tissue laceration along the ulnar side of the forearm. No radiodense foreign body. No evidence of fracture dislocation of the forearm.  IMPRESSION: No fracture or foreign body.   Electronically Signed   By: Genevive BiStewart  Edmunds M.D.   On: 07/16/2014 02:10     EKG Interpretation None      MDM   Final diagnoses:  Laceration  Forearm laceration, left, initial encounter   Pt presenting to ED with laceration to Left forearm from shattered glass. No foreign bodies seen on exam or Plain films. Laceration repaired with 5 sutures. Home care instructions provided. Advised to have sutures removed in 10-14 days. Rx: tramadol. Return precautions provided. Pt verbalized understanding and agreement with tx plan.    Junius FinnerErin O'Malley, PA-C 07/16/14 0518  Junius FinnerErin O'Malley, PA-C 07/16/14 [phone]  Marisa Severinlga Otter, MD 07/16/14 [phone]

## 2014-07-16 NOTE — ED Notes (Signed)
Patient states she dropped a glass cutting her arm.

## 2014-07-16 NOTE — Discharge Instructions (Signed)
You have been prescribed Tramadol for severe pain.  This medication may cause drowsiness.  Do not drive while taking.  You may also take ibuprofen with this medication as needed for pain. See below for additional instructions.

## 2014-07-25 ENCOUNTER — Telehealth: Payer: Self-pay | Admitting: Family

## 2014-07-25 NOTE — Telephone Encounter (Signed)
Patient is calling expressing concern on sutre site. She has an appt for Friday, but the site is painful and a light red. Please give the patient a call to discuss options.

## 2014-07-25 NOTE — Telephone Encounter (Signed)
Please inform the patient of things to watch out for including signs of infection including increased heat, discharge and excessive redness. Please keep the area clean and dry. If symptoms worsen then seek care at [facility] if concerned.

## 2014-07-26 NOTE — Telephone Encounter (Signed)
Left VM of Greg's response below.

## 2014-07-27 ENCOUNTER — Encounter: Payer: Self-pay | Admitting: Family

## 2014-07-27 ENCOUNTER — Ambulatory Visit (INDEPENDENT_AMBULATORY_CARE_PROVIDER_SITE_OTHER): Payer: 59 | Admitting: Family

## 2014-07-27 VITALS — BP 110/70 | HR 76 | Temp 97.6°F | Resp 18 | Ht 62.0 in | Wt 197.8 lb

## 2014-07-27 DIAGNOSIS — S41112D Laceration without foreign body of left upper arm, subsequent encounter: Secondary | ICD-10-CM

## 2014-07-27 DIAGNOSIS — S41119A Laceration without foreign body of unspecified upper arm, initial encounter: Secondary | ICD-10-CM | POA: Insufficient documentation

## 2014-07-27 NOTE — Patient Instructions (Signed)
Thank you for choosing [facility].  Summary/Instructions:  f your symptoms worsen or fail to improve, please contact our office for further instruction, or in case of emergency go directly to the emergency room at the closest medical facility.   Laceration Care, Adult A laceration is a cut or lesion that goes through all layers of the skin and into the tissue just beneath the skin. TREATMENT  Some lacerations may not require closure. Some lacerations may not be able to be closed due to an increased risk of infection. It is important to see your caregiver as soon as possible after an injury to minimize the risk of infection and maximize the opportunity for successful closure. If closure is appropriate, pain medicines may be given, if needed. The wound will be cleaned to help prevent infection. Your caregiver will use stitches (sutures), staples, wound glue (adhesive), or skin adhesive strips to repair the laceration. These tools bring the skin edges together to allow for faster healing and a better cosmetic outcome. However, all wounds will heal with a scar. Once the wound has healed, scarring can be minimized by covering the wound with sunscreen during the day for 1 full year. HOME CARE INSTRUCTIONS  For sutures or staples:  Keep the wound clean and dry.  If you were given a bandage (dressing), you should change it at least once a day. Also, change the dressing if it becomes wet or dirty, or as directed by your caregiver.  Wash the wound with soap and water 2 times a day. Rinse the wound off with water to remove all soap. Pat the wound dry with a clean towel.  After cleaning, apply a thin layer of the antibiotic ointment as recommended by your caregiver. This will help prevent infection and keep the dressing from sticking.  You may shower as usual after the first 24 hours. Do not soak the wound in water until the sutures are removed.  Only take over-the-counter or prescription  medicines for pain, discomfort, or fever as directed by your caregiver.  Get your sutures or staples removed as directed by your caregiver. For skin adhesive strips:  Keep the wound clean and dry.  Do not get the skin adhesive strips wet. You may bathe carefully, using caution to keep the wound dry.  If the wound gets wet, pat it dry with a clean towel.  Skin adhesive strips will fall off on their own. You may trim the strips as the wound heals. Do not remove skin adhesive strips that are still stuck to the wound. They will fall off in time. For wound adhesive:  You may briefly wet your wound in the shower or bath. Do not soak or scrub the wound. Do not swim. Avoid periods of heavy perspiration until the skin adhesive has fallen off on its own. After showering or bathing, gently pat the wound dry with a clean towel.  Do not apply liquid medicine, cream medicine, or ointment medicine to your wound while the skin adhesive is in place. This may loosen the film before your wound is healed.  If a dressing is placed over the wound, be careful not to apply tape directly over the skin adhesive. This may cause the adhesive to be pulled off before the wound is healed.  Avoid prolonged exposure to sunlight or tanning lamps while the skin adhesive is in place. Exposure to ultraviolet light in the first year will darken the scar.  The skin adhesive will usually remain in place for  5 to 10 days, then naturally fall off the skin. Do not pick at the adhesive film. You may need a tetanus shot if:  You cannot remember when you had your last tetanus shot.  You have never had a tetanus shot. If you get a tetanus shot, your arm may swell, get red, and feel warm to the touch. This is common and not a problem. If you need a tetanus shot and you choose not to have one, there is a rare chance of getting tetanus. Sickness from tetanus can be serious. SEEK MEDICAL CARE IF:   You have redness, swelling, or  increasing pain in the wound.  You see a red line that goes away from the wound.  You have yellowish-white fluid (pus) coming from the wound.  You have a fever.  You notice a bad smell coming from the wound or dressing.  Your wound breaks open before or after sutures have been removed.  You notice something coming out of the wound such as wood or glass.  Your wound is on your hand or foot and you cannot move a finger or toe. SEEK IMMEDIATE MEDICAL CARE IF:   Your pain is not controlled with prescribed medicine.  You have severe swelling around the wound causing pain and numbness or a change in color in your arm, hand, leg, or foot.  Your wound splits open and starts bleeding.  You have worsening numbness, weakness, or loss of function of any joint around or beyond the wound.  You develop painful lumps near the wound or on the skin anywhere on your body. MAKE SURE YOU:   Understand these instructions.  Will watch your condition.  Will get help right away if you are not doing well or get worse. Document Released: 03/02/2005 Document Revised: 05/25/2011 Document Reviewed: 08/26/2010 [facility] Patient Information 2015 [employer] [place]. This information is not intended to replace advice given to you by your health care provider. Make sure you discuss any questions you have with your health care provider.

## 2014-07-27 NOTE — Assessment & Plan Note (Signed)
Well healing laceration. Stiches removed without complication and tolerated well by patient. Continue to cleanse wound with soap and water. Discussed signs of infection and when to seek further care. Follow up if symptoms worsen or fail to improve.

## 2014-07-27 NOTE — Progress Notes (Signed)
Pre visit review using our clinic review tool, if applicable. No additional management support is needed unless otherwise documented below in the visit note. 

## 2014-07-27 NOTE — Progress Notes (Signed)
   Subjective:    Patient ID: Breanna Garcia, female    DOB: 1981/02/24, 34 y.o.   MRN: [id number]  Chief Complaint  Patient presents with  . Suture / Staple Removal    needs stitches out    HPI:  Breanna Garcia is a 34 y.o. female who presents today for an office visit.   Recently seen in the ED for a laceration to her left forearm where she received 5 sutures. Today is the 11th day since the sutures were placed and she is here to have them removed.   Allergies  Allergen Reactions  . Penicillins Hives    Current Outpatient Prescriptions on File Prior to Visit  Medication Sig Dispense Refill  . betamethasone valerate ointment (VALISONE) 0.1 % Apply 1 application topically 2 (two) times daily. 30 g 0  . buPROPion (ZYBAN) 150 MG 12 hr tablet Take 1 tablet daily for 3 days then 1 tablet 2 times daily. 60 tablet 1  . LORazepam (ATIVAN) 1 MG tablet Take 1 mg by mouth every 8 (eight) hours as needed for anxiety.     . norethindrone (MICRONOR,CAMILA,ERRIN) 0.35 MG tablet Take 1 tablet by mouth daily.    Marland Kitchen. PRESCRIPTION MEDICATION Apply 1 application topically 2 (two) times daily. Sample given to patient from her doctor for  psoriasis ointment    . traMADol (ULTRAM) 50 MG tablet Take 1 tablet (50 mg total) by mouth every 6 (six) hours as needed. 15 tablet 0   No current facility-administered medications on file prior to visit.    Review of Systems  Constitutional: Negative for fever and chills.  Skin: Positive for wound.      Objective:    BP 110/70 mmHg  Pulse 76  Temp(Src) 97.6 F (36.4 C) (Oral)  Resp 18  Ht 5\' 2"  (1.575 m)  Wt 197 lb 12.8 oz (89.721 kg)  BMI 36.17 kg/m2  SpO2 98%  LMP 07/02/2014 (Approximate) Nursing note and vital signs reviewed.  Physical Exam  Constitutional: She is oriented to person, place, and time. She appears well-developed and well-nourished. No distress.  Cardiovascular: Normal rate, regular rhythm, normal heart sounds and intact distal  pulses.   Pulmonary/Chest: Effort normal and breath sounds normal.  Neurological: She is alert and oriented to person, place, and time.  Skin: Skin is warm and dry.  4 cm laceration of left forearm with good epithealization and 5 stiches present. No discharge or signs of infection noted.   Psychiatric: She has a normal mood and affect. Her behavior is normal. Judgment and thought content normal.       Assessment & Plan:

## 2015-02-17 ENCOUNTER — Emergency Department (HOSPITAL_COMMUNITY)
Admission: EM | Admit: 2015-02-17 | Discharge: 2015-02-17 | Disposition: A | Discharge status: Home / Self Care | Payer: 59 | Attending: Physician Assistant | Admitting: Physician Assistant

## 2015-02-17 ENCOUNTER — Encounter (HOSPITAL_COMMUNITY): Payer: Self-pay | Admitting: Emergency Medicine

## 2015-02-17 DIAGNOSIS — Y998 Other external cause status: Secondary | ICD-10-CM | POA: Insufficient documentation

## 2015-02-17 DIAGNOSIS — Z872 Personal history of diseases of the skin and subcutaneous tissue: Secondary | ICD-10-CM | POA: Insufficient documentation

## 2015-02-17 DIAGNOSIS — T7809XA Anaphylactic reaction due to other food products, initial encounter: Secondary | ICD-10-CM | POA: Insufficient documentation

## 2015-02-17 DIAGNOSIS — Y9389 Activity, other specified: Secondary | ICD-10-CM | POA: Insufficient documentation

## 2015-02-17 DIAGNOSIS — Z880 Allergy status to penicillin: Secondary | ICD-10-CM | POA: Insufficient documentation

## 2015-02-17 DIAGNOSIS — X58XXXA Exposure to other specified factors, initial encounter: Secondary | ICD-10-CM | POA: Insufficient documentation

## 2015-02-17 DIAGNOSIS — K1379 Other lesions of oral mucosa: Secondary | ICD-10-CM | POA: Insufficient documentation

## 2015-02-17 DIAGNOSIS — R600 Localized edema: Secondary | ICD-10-CM | POA: Insufficient documentation

## 2015-02-17 DIAGNOSIS — G8929 Other chronic pain: Secondary | ICD-10-CM | POA: Insufficient documentation

## 2015-02-17 DIAGNOSIS — T782XXA Anaphylactic shock, unspecified, initial encounter: Secondary | ICD-10-CM

## 2015-02-17 DIAGNOSIS — F17210 Nicotine dependence, cigarettes, uncomplicated: Secondary | ICD-10-CM | POA: Insufficient documentation

## 2015-02-17 DIAGNOSIS — Y9289 Other specified places as the place of occurrence of the external cause: Secondary | ICD-10-CM | POA: Insufficient documentation

## 2015-02-17 DIAGNOSIS — Z8659 Personal history of other mental and behavioral disorders: Secondary | ICD-10-CM | POA: Insufficient documentation

## 2015-02-17 MED ORDER — SODIUM CHLORIDE 0.9 % IV BOLUS (SEPSIS)
1000.0000 mL | Freq: Once | INTRAVENOUS | Status: AC
  Administered 2015-02-17: 1000 mL via INTRAVENOUS

## 2015-02-17 MED ORDER — EPINEPHRINE 0.3 MG/0.3ML IJ SOAJ: 0.3000 mg | Freq: Once | INTRAMUSCULAR | Status: DC

## 2015-02-17 MED ORDER — PREDNISONE 10 MG PO TABS
20.0000 mg | ORAL_TABLET | Freq: Every day | ORAL | Status: DC
Start: 2015-02-17 — End: 2015-06-29

## 2015-02-17 MED ORDER — DIPHENHYDRAMINE HCL 50 MG/ML IJ SOLN
25.0000 mg | Freq: Once | INTRAMUSCULAR | Status: AC
  Administered 2015-02-17: 25 mg via INTRAVENOUS
  Filled 2015-02-17: qty 1

## 2015-02-17 MED ORDER — METHYLPREDNISOLONE SODIUM SUCC 125 MG IJ SOLR
80.0000 mg | Freq: Once | INTRAMUSCULAR | Status: AC
  Administered 2015-02-17: 80 mg via INTRAVENOUS
  Filled 2015-02-17: qty 2

## 2015-02-17 MED ORDER — FAMOTIDINE IN NACL 20-0.9 MG/50ML-% IV SOLN
20.0000 mg | Freq: Once | INTRAVENOUS | Status: AC
  Administered 2015-02-17: 20 mg via INTRAVENOUS
  Filled 2015-02-17: qty 50

## 2015-02-17 MED ORDER — EPINEPHRINE 0.3 MG/0.3ML IJ SOAJ
0.3000 mg | Freq: Once | INTRAMUSCULAR | Status: AC
  Administered 2015-02-17: 0.3 mg via INTRAMUSCULAR
  Filled 2015-02-17: qty 0.3

## 2015-02-17 NOTE — ED Provider Notes (Signed)
CSN: [id number]     Arrival date & time 02/17/15  0645 History   First MD Initiated Contact with Patient 02/17/15 0701     Chief Complaint  Patient presents with  . Allergic Reaction     (Consider location/radiation/quality/duration/timing/severity/associated sxs/prior Treatment) HPI   Patient is a 34 year old female with allergy to penicillin presenting today with allergic reaction. Patient reports that she was eating everything bagel in her car, she got out to pump gas, and got back in the car and started eating her bagel again. Then patient developed tingling and then noticed that she had swelling around her eyes. She called her mom and sister and came here to the emergency department. Patient has not had an allergic reaction like this in the past.   Past Medical History  Diagnosis Date  . Chronic back pain   . Abscess   . Anxiety    Past Surgical History  Procedure Laterality Date  . Tonsillectomy and adenoidectomy    . Wisdom tooth extraction     Family History  Problem Relation Age of Onset  . Hypothyroidism Mother   . Stroke Maternal Grandmother   . Hypertension Maternal Grandfather   . Heart disease Maternal Grandfather    Social History  Substance Use Topics  . Smoking status: Current Every Day Smoker -- 0.50 packs/day for 20 years    Types: Cigarettes  . Smokeless tobacco: Never Used  . Alcohol Use: 0.0 oz/week    0 Standard drinks or equivalent per week     Comment: occasionally   OB History    No data available     Review of Systems  Constitutional: Negative for activity change and fatigue.  Respiratory: Negative for shortness of breath.   Cardiovascular: Negative for chest pain.  Gastrointestinal: Negative for abdominal pain.  All other systems reviewed and are negative.     Allergies  Penicillins  Home Medications   Prior to Admission medications   Medication Sig Start Date End Date Taking? Authorizing Provider  aspirin-sod bicarb-citric  acid (ALKA-SELTZER) 325 MG TBEF tablet Take 325 mg by mouth every 6 (six) hours as needed (pain/cold).   Yes Historical Provider, MD   BP 102/60 mmHg  Pulse 85  Temp(Src) 97.5 F (36.4 C) (Oral)  Resp 19  SpO2 99% Physical Exam  Constitutional: She is oriented to person, place, and time. She appears well-developed and well-nourished.  HENT:  Head: Normocephalic and atraumatic.  Swelling around eyes and lips.  Mild uvula swelling  Eyes: Conjunctivae are normal. Right eye exhibits no discharge.  Neck: Neck supple.  Cardiovascular: Normal rate, regular rhythm and normal heart sounds.   No murmur heard. Pulmonary/Chest: Effort normal. She has no rales.  A couple scattered wheezes.  Abdominal: Soft. She exhibits no distension. There is no tenderness.  Musculoskeletal: Normal range of motion. She exhibits no edema.  Neurological: She is oriented to person, place, and time. No cranial nerve deficit.  Skin: Skin is warm and dry. No rash noted. She is not diaphoretic.  No hives or itching.  Psychiatric: She has a normal mood and affect. Her behavior is normal.  Nursing note and vitals reviewed.   ED Course  Procedures (including critical care time) Labs Review Labs Reviewed - No data to display  Imaging Review No results found. I have personally reviewed and evaluated these images and lab results as part of my medical decision-making.   EKG Interpretation None      MDM   Final diagnoses:  None    Patient a pleasant [age] old female presenting with allergic reaction. Unsure what the trigger is. Long discussion had with patient and family about avoiding anything heavier to its everything bagels until she can get allergic testing done. Patient family already has an allergist that they use. We will give IM epi along with the usual allergic cocktail today given the amount of swelling and mild swelling to the uvula.  We'll watch for 6 hours.    8:18 AM Patient improved with  initial treatment.  10:00 am patietn improving  CRITICAL CARE Performed by: Arlana Hoveourteney L Catcher Dehoyos Total critical care time: 30 minutes Critical care time was exclusive of separately billable procedures and treating other patients. Critical care was necessary to treat or prevent imminent or life-threatening deterioration. Critical care was time spent personally by me on the following activities: development of treatment plan with patient and/or surrogate as well as nursing, discussions with consultants, evaluation of patient's response to treatment, examination of patient, obtaining history from patient or surrogate, ordering and performing treatments and interventions, ordering and review of laboratory studies, ordering and review of radiographic studies, pulse oximetry and re-evaluation of patient's condition.   12:02 PM All swelling has resolved. Patient understands what to avoid, return precautions and to take steroids and Benadryl.Abelino Derrick.  Diallo Ponder Lyn Yenifer Saccente, MD 02/17/15 [id number]

## 2015-02-17 NOTE — Discharge Instructions (Signed)
Avoid anything related to what you ate. Follow up with allergist. Take medications as prescribed. Take Benadryl as well as prescribed meds. Keep epi with you at all times.   Anaphylactic Reaction An anaphylactic reaction is a sudden, severe allergic reaction. It affects the whole body. It can be life threatening. You may need to stay in the hospital.  Climax a medical bracelet or necklace that lists your allergy.  Carry your allergy kit or medicine shot to treat severe allergic reactions with you. These can save your life.  Do not drive until medicine from your shot has worn off, unless your doctor says it is okay.  If you have hives or a rash:  Take medicine as told by your doctor.  You may take over-the-counter antihistamine medicine.  Place cold cloths on your skin. Take baths in cool water. Avoid hot baths and hot showers. GET HELP RIGHT AWAY IF:   Your mouth is puffy (swollen), or you have trouble breathing.  You start making whistling sounds when you breathe (wheezing).  You have a tight feeling in your chest or throat.  You have a rash, hives, puffiness, or itching on your body.  You throw up (vomit) or have watery poop (diarrhea).  You feel dizzy or pass out (faint).  You think you are having an allergic reaction.  You have new symptoms. This is an emergency. Use your medicine shot or allergy kit as told. Call your local emergency services (911 in [place] Even if you feel better after the shot, you need to go to the hospital emergency department. MAKE SURE YOU:   Understand these instructions.  Will watch your condition.  Will get help right away if you are not doing well or get worse.   This information is not intended to replace advice given to you by your health care provider. Make sure you discuss any questions you have with your health care provider.   Document Released: 08/19/2007 Document Revised: 09/01/2011 Document Reviewed: 09/12/2014 Elsevier  Interactive Patient Education [employer].

## 2015-02-17 NOTE — ED Notes (Signed)
Pt was driving to work and pulled into a gas station when she experienced an acute onset allergic reaction. Pt with angio edema, swollen eyes, and scratchy throat.

## 2015-02-17 NOTE — ED Notes (Signed)
Pt reports she feels better and less swollen.

## 2015-06-27 ENCOUNTER — Ambulatory Visit: Payer: Self-pay | Admitting: Family

## 2015-06-29 ENCOUNTER — Encounter: Payer: Self-pay | Admitting: Family Medicine

## 2015-06-29 ENCOUNTER — Ambulatory Visit (INDEPENDENT_AMBULATORY_CARE_PROVIDER_SITE_OTHER): Payer: BLUE CROSS/BLUE SHIELD | Admitting: Family Medicine

## 2015-06-29 VITALS — BP 110/76 | HR 81 | Temp 98.3°F | Resp 20 | Ht 62.0 in | Wt 183.8 lb

## 2015-06-29 DIAGNOSIS — J209 Acute bronchitis, unspecified: Secondary | ICD-10-CM | POA: Diagnosis not present

## 2015-06-29 MED ORDER — HYDROCODONE-HOMATROPINE 5-1.5 MG/5ML PO SYRP: 5.0000 mL | ORAL_SOLUTION | ORAL | Status: DC | PRN

## 2015-06-29 MED ORDER — AZITHROMYCIN 250 MG PO TABS: ORAL_TABLET | ORAL | Status: DC

## 2015-06-29 NOTE — Progress Notes (Signed)
   Subjective:    Patient ID: Breanna Garcia, female    DOB: 01/08/1981, 35 y.o.   MRN: [id number]  HPI Here for one week of chest congestion and a dry cough. Using Mucinex and Delsym.   Review of Systems  Constitutional: Negative.   HENT: Positive for congestion and postnasal drip. Negative for sinus pressure and sore throat.   Eyes: Negative.   Respiratory: Positive for cough and chest tightness. Negative for shortness of breath and wheezing.        Objective:   Physical Exam  Constitutional: She appears well-developed and well-nourished.  HENT:  Right Ear: External ear normal.  Left Ear: External ear normal.  Nose: Nose normal.  Mouth/Throat: Oropharynx is clear and moist.  Eyes: Conjunctivae are normal.  Pulmonary/Chest: Effort normal. No respiratory distress. She has no wheezes. She has no rales.  scattered rhonchi   Lymphadenopathy:    She has no cervical adenopathy.          Assessment & Plan:  Bronchitis, treat with a Zpack

## 2015-07-08 ENCOUNTER — Telehealth: Payer: Self-pay

## 2015-07-08 MED ORDER — FLUCONAZOLE 150 MG PO TABS: 150.0000 mg | ORAL_TABLET | Freq: Once | ORAL | Status: DC

## 2015-07-08 NOTE — Telephone Encounter (Signed)
Diflucan sent into pharmacy. Pt aware.

## 2015-07-08 NOTE — Telephone Encounter (Signed)
Patient states she was here last Saturday and got anitbiotics and now has a yeast infection and would like something for it. Please advise.

## 2016-08-13 ENCOUNTER — Emergency Department (HOSPITAL_COMMUNITY)
Admission: EM | Admit: 2016-08-13 | Discharge: 2016-08-13 | Disposition: A | Discharge status: Home / Self Care | Payer: Self-pay | Attending: Emergency Medicine | Admitting: Emergency Medicine

## 2016-08-13 ENCOUNTER — Encounter (HOSPITAL_COMMUNITY): Payer: Self-pay

## 2016-08-13 ENCOUNTER — Emergency Department (HOSPITAL_COMMUNITY): Payer: Self-pay

## 2016-08-13 DIAGNOSIS — F17210 Nicotine dependence, cigarettes, uncomplicated: Secondary | ICD-10-CM | POA: Insufficient documentation

## 2016-08-13 DIAGNOSIS — H6591 Unspecified nonsuppurative otitis media, right ear: Secondary | ICD-10-CM | POA: Insufficient documentation

## 2016-08-13 DIAGNOSIS — Z79899 Other long term (current) drug therapy: Secondary | ICD-10-CM | POA: Insufficient documentation

## 2016-08-13 DIAGNOSIS — J069 Acute upper respiratory infection, unspecified: Secondary | ICD-10-CM | POA: Insufficient documentation

## 2016-08-13 MED ORDER — IBUPROFEN 800 MG PO TABS: 800.0000 mg | ORAL_TABLET | Freq: Once | ORAL | Status: DC

## 2016-08-13 MED ORDER — CEFDINIR 300 MG PO CAPS: 300.0000 mg | ORAL_CAPSULE | Freq: Two times a day (BID) | ORAL | 0 refills | Status: DC

## 2016-08-13 NOTE — ED Triage Notes (Signed)
For 6 days non productive cough and states chest burns with cough no fever voiced right ear pain and sore throat no drooling no hoarseness noted.

## 2016-08-13 NOTE — Discharge Instructions (Signed)
You may alternate Tylenol 1000 mg every 6 hours as needed for fever and pain and ibuprofen 800 mg every 8 hours as needed for fever and pain. Please rest and drink plenty of fluids. You may use over-the-counter nasal saline spray and Afrin nasal saline spray as needed for nasal congestion. Please do not use Afrin for more than 3 days in a row. You may use Mucinex as needed for cough.

## 2016-08-13 NOTE — ED Notes (Signed)
Patient is alert and oriented x3.  She was given DC instructions and follow up visit instructions.  Patient gave verbal understanding. She was DC ambulatory under her own power to home.  V/S stable.  He was not showing any signs of distress on DC 

## 2016-08-13 NOTE — ED Provider Notes (Signed)
TIME SEEN: 5:04 AM  CHIEF COMPLAINT: Cough, right ear pain  HPI: Patient is a 36 year old female with no significant past medical history who presents emergency per to 3 days of dry nonproductive cough, right ear pain, sore throat, body aches. Unknown if she has had fever. No vomiting or diarrhea. States she is having burning in her chest with coughing. No shortness of breath.  ROS: See HPI Constitutional: no fever  Eyes: no drainage  ENT: no runny nose   Cardiovascular:   chest pain with coughing Resp: no SOB  GI: no vomiting GU: no dysuria Integumentary: no rash  Allergy: no hives  Musculoskeletal: no leg swelling  Neurological: no slurred speech ROS otherwise negative  PAST MEDICAL HISTORY/PAST SURGICAL HISTORY:  Past Medical History:  Diagnosis Date  . Abscess   . Anxiety   . Chronic back pain     MEDICATIONS:  Prior to Admission medications   Medication Sig Start Date End Date Taking? Authorizing Provider  EPINEPHrine 0.3 mg/0.3 mL IJ SOAJ injection Inject 0.3 mLs (0.3 mg total) into the muscle once. 02/17/15  Yes Mackuen, Courteney Lyn, MD  ibuprofen (ADVIL,MOTRIN) 200 MG tablet Take 400 mg by mouth every 6 (six) hours as needed for moderate pain.    Yes [provider]  azithromycin (ZITHROMAX Z-PAK) 250 MG tablet As directed Patient not taking: Reported on 08/13/2016 06/29/15   Nelwyn Salisbury, MD  fluconazole (DIFLUCAN) 150 MG tablet Take 1 tablet (150 mg total) by mouth once. Repeat if needed Patient not taking: Reported on 08/13/2016 07/08/15   Veryl Speak, FNP  HYDROcodone-homatropine (HYDROMET) 5-1.5 MG/5ML syrup Take 5 mLs by mouth every 4 (four) hours as needed. Patient not taking: Reported on 08/13/2016 06/29/15   Nelwyn Salisbury, MD    ALLERGIES:  Allergies  Allergen Reactions  . Penicillins Hives    Has patient had a PCN reaction causing immediate rash, facial/tongue/throat swelling, SOB or lightheadedness with hypotension: No Has patient had a  PCN reaction causing severe rash involving mucus membranes or skin necrosis: No Has patient had a PCN reaction that required hospitalization: No Has patient had a PCN reaction occurring within the last 10 years: No If all of the above answers are "NO", then may proceed with Cephalosporin use.     SOCIAL HISTORY:  Social History  Substance Use Topics  . Smoking status: Current Every Day Smoker    Packs/day: 0.50    Years: 20.00    Types: Cigarettes  . Smokeless tobacco: Never Used  . Alcohol use 0.0 oz/week     Comment: occasionally    FAMILY HISTORY: Family History  Problem Relation Age of Onset  . Hypothyroidism Mother   . Stroke Maternal Grandmother   . Hypertension Maternal Grandfather   . Heart disease Maternal Grandfather     EXAM: BP 132/87 (BP Location: Left Arm)   Pulse 92   Temp 97.7 F (36.5 C) (Oral)   Resp 18   Ht 5\' 2"  (1.575 m)   Wt 77.1 kg (170 lb)   SpO2 100%   BMI 31.09 kg/m  CONSTITUTIONAL: Alert and oriented and responds appropriately to questions. Well-appearing; well-nourished, Afebrile and nontoxic HEAD: Normocephalic EYES: Conjunctivae clear, pupils appear equal, EOMI ENT: normal nose; moist mucous membranes; No pharyngeal erythema or petechiae, no tonsillar hypertrophy or exudate, no uvular deviation, no unilateral swelling, no trismus or drooling, no muffled voice, normal phonation, no stridor, no dental caries present, no drainable dental abscess noted, no Ludwig's angina, tongue  sits flat in the bottom of the mouth, no angioedema, no facial erythema or warmth, no facial swelling; no pain with movement of the neck.  TM on the left is clear without erythema, purulence, bulging, perforation, effusion. TM on the right is erythematous with bulging and associated purulent effusion but no perforation. No cerumen impaction or sign of foreign body in the external auditory canal. No inflammation, erythema or drainage from the external auditory canal. No  signs of mastoiditis. No pain with manipulation of the pinna bilaterally. NECK: Supple, no meningismus, no nuchal rigidity, no LAD  CARD: RRR; S1 and S2 appreciated; no murmurs, no clicks, no rubs, no gallops RESP: Normal chest excursion without splinting or tachypnea; breath sounds clear and equal bilaterally; no wheezes, no rhonchi, no rales, no hypoxia or respiratory distress, speaking full sentences ABD/GI: Normal bowel sounds; non-distended; soft, non-tender, no rebound, no guarding, no peritoneal signs, no hepatosplenomegaly BACK:  The back appears normal and is non-tender to palpation, there is no CVA tenderness EXT: Normal ROM in all joints; non-tender to palpation; no edema; normal capillary refill; no cyanosis, no calf tenderness or swelling    SKIN: Normal color for age and race; warm; no rash NEURO: Moves all extremities equally PSYCH: The patient's mood and manner are appropriate. Grooming and personal hygiene are appropriate.  MEDICAL DECISION MAKING: Patient here with right otitis media. No signs of strep pharyngitis on exam, deep space neck infection, meningitis. Chest x-ray clear and shows no pneumonia. Patient states she gets a rash with penicillin. Will discharge on cefdinir for her ear infection. Recommended alternating Tylenol and Motrin for fever and pain. Recommended over-the-counter Afrin and Mucinex as needed for nasal congestion and cough. She is otherwise well-appearing, nontoxic. No hypoxia. No increased work of breathing or respiratory distress. I feel she is safe for discharge.  At this time, I do not feel there is any life-threatening condition present. I have reviewed and discussed all results (EKG, imaging, lab, urine as appropriate) and exam findings with patient/family. I have reviewed nursing notes and appropriate previous records.  I feel the patient is safe to be discharged home without further emergent workup and can continue workup as an outpatient as needed.  Discussed usual and customary return precautions. Patient/family verbalize understanding and are comfortable with this plan.  Outpatient follow-up has been provided if needed. All questions have been answered.      Marcellis Frampton, Layla MawKristen N, DO 08/13/16 [phone]

## 2016-09-26 ENCOUNTER — Emergency Department (HOSPITAL_COMMUNITY): Payer: Self-pay

## 2016-09-26 ENCOUNTER — Encounter (HOSPITAL_COMMUNITY): Payer: Self-pay

## 2016-09-26 ENCOUNTER — Emergency Department (HOSPITAL_COMMUNITY)
Admission: EM | Admit: 2016-09-26 | Discharge: 2016-09-26 | Disposition: A | Discharge status: Home / Self Care | Payer: Self-pay | Attending: Emergency Medicine | Admitting: Emergency Medicine

## 2016-09-26 DIAGNOSIS — Y9302 Activity, running: Secondary | ICD-10-CM | POA: Insufficient documentation

## 2016-09-26 DIAGNOSIS — F17210 Nicotine dependence, cigarettes, uncomplicated: Secondary | ICD-10-CM | POA: Insufficient documentation

## 2016-09-26 DIAGNOSIS — M25531 Pain in right wrist: Secondary | ICD-10-CM | POA: Insufficient documentation

## 2016-09-26 DIAGNOSIS — M25571 Pain in right ankle and joints of right foot: Secondary | ICD-10-CM | POA: Insufficient documentation

## 2016-09-26 DIAGNOSIS — M79671 Pain in right foot: Secondary | ICD-10-CM | POA: Insufficient documentation

## 2016-09-26 DIAGNOSIS — W109XXA Fall (on) (from) unspecified stairs and steps, initial encounter: Secondary | ICD-10-CM | POA: Insufficient documentation

## 2016-09-26 DIAGNOSIS — Y929 Unspecified place or not applicable: Secondary | ICD-10-CM | POA: Insufficient documentation

## 2016-09-26 DIAGNOSIS — Z79899 Other long term (current) drug therapy: Secondary | ICD-10-CM | POA: Insufficient documentation

## 2016-09-26 DIAGNOSIS — Y999 Unspecified external cause status: Secondary | ICD-10-CM | POA: Insufficient documentation

## 2016-09-26 NOTE — Discharge Instructions (Signed)
Please read attached information regarding your condition. Use ankle brace and wrist splint as directed. Follow-up with orthopedics for further evaluation. Continue ibuprofen or Tylenol as needed for pain. Return to ED for worsening pain, additional injury, numbness, weakness, inability to walk, headache injury or loss of consciousness.

## 2016-09-26 NOTE — ED Provider Notes (Signed)
[facility]-EMERGENCY DEPT Provider Note   CSN: [id number] Arrival date & time: 09/26/16  2109     History   Chief Complaint Chief Complaint  Patient presents with  . Fall    HPI Breanna Garcia is a 36 y.o. female.  HPI Patient presents to the ED for right foot pain, ankle pain and wrist pain that began after mechanical fall earlier today. She states that she was running up the stairs when she fell. She reports being able to bear weight but reports pain in the ankle and below her toes. She reports previous history of possible fractures in foot but unsure which foot. She also reports previous soft tissue swelling in her right wrist a few months ago but denies any fractures or dislocations in this area. Has not had any surgical procedures in either foot, ankle or wrist. She denies any head injury or loss of consciousness.  Past Medical History:  Diagnosis Date  . Abscess   . Anxiety   . Chronic back pain     Patient Active Problem List   Diagnosis Date Noted  . Laceration of arm 07/27/2014  . Routine general medical examination at a health care facility 05/04/2014  . Psoriasis 04/24/2014  . Tobacco use disorder 04/24/2014  . Migraine 04/24/2014    Past Surgical History:  Procedure Laterality Date  . TONSILLECTOMY AND ADENOIDECTOMY    . WISDOM TOOTH EXTRACTION      OB History    No data available       Home Medications    Prior to Admission medications   Medication Sig Start Date End Date Taking? Authorizing Provider  cefdinir (OMNICEF) 300 MG capsule Take 1 capsule (300 mg total) by mouth 2 (two) times daily. 08/13/16   Ward, Layla Maw, DO  EPINEPHrine 0.3 mg/0.3 mL IJ SOAJ injection Inject 0.3 mLs (0.3 mg total) into the muscle once. 02/17/15   Mackuen, Courteney Lyn, MD  ibuprofen (ADVIL,MOTRIN) 200 MG tablet Take 400 mg by mouth every 6 (six) hours as needed for moderate pain.     [provider]    Family History Family History  Problem Relation Age of  Onset  . Hypothyroidism Mother   . Stroke Maternal Grandmother   . Hypertension Maternal Grandfather   . Heart disease Maternal Grandfather     Social History Social History  Substance Use Topics  . Smoking status: Current Every Day Smoker    Packs/day: 0.50    Years: 20.00    Types: Cigarettes  . Smokeless tobacco: Never Used  . Alcohol use 0.0 oz/week     Comment: occasionally     Allergies   Penicillins   Review of Systems Review of Systems  Musculoskeletal: Positive for arthralgias and myalgias. Negative for neck pain and neck stiffness.  Skin: Negative for color change and wound.  Neurological: Negative for weakness, light-headedness and headaches.     Physical Exam Updated Vital Signs BP (!) 138/92 (BP Location: Left Arm)   Pulse 89   Temp 98.3 F (36.8 C) (Oral)   Resp 18   Ht 5\' 2"  (1.575 m)   Wt 77.1 kg (170 lb)   LMP 09/21/2016 (Approximate)   SpO2 98%   BMI 31.09 kg/m   Physical Exam  Constitutional: She appears well-developed and well-nourished. No distress.  HENT:  Head: Normocephalic and atraumatic.  Eyes: Conjunctivae and EOM are normal. No scleral icterus.  Neck: Normal range of motion.  Pulmonary/Chest: Effort normal. No respiratory distress.  Musculoskeletal:  Feet:  Pain at the indicated areas of the right foot. No significant edema, color or temperature change noted. Full range of motion of the ankle and digits. Sensation intact to light touch. Pain with flexion of the right wrist but no tenderness to palpation noted. No visible deformity noted. Strength 5/5 in bilateral upper and lower extremities. Sensation intact to light touch in upper extremities.  Neurological: She is alert.  Skin: No rash noted. She is not diaphoretic.  Psychiatric: She has a normal mood and affect.  Nursing note and vitals reviewed.    ED Treatments / Results  Labs (all labs ordered are listed, but only abnormal results are displayed) Labs Reviewed -  No data to display  EKG  EKG Interpretation None       Radiology Dg Wrist Complete Right  Result Date: 09/26/2016 CLINICAL DATA:  Fall with wrist pain EXAM: RIGHT WRIST - COMPLETE 3+ VIEW COMPARISON:  01/29/2007 FINDINGS: No acute fracture or malalignment. Old deformity of the fifth metacarpal. Soft tissues are unremarkable. IMPRESSION: No acute osseous abnormality. Electronically Signed   By: Jasmine Pang M.D.   On: 09/26/2016 22:12   Dg Ankle Complete Right  Result Date: 09/26/2016 CLINICAL DATA:  Fall with ankle pain EXAM: RIGHT ANKLE - COMPLETE 3+ VIEW COMPARISON:  None. FINDINGS: No fracture or malalignment is seen. Small plantar calcaneal spur. Mild degenerative changes at the lateral ankle joint. IMPRESSION: No acute osseous abnormality Electronically Signed   By: Jasmine Pang M.D.   On: 09/26/2016 22:11   Dg Foot Complete Right  Result Date: 09/26/2016 CLINICAL DATA:  Fall with foot pain EXAM: RIGHT FOOT COMPLETE - 3+ VIEW COMPARISON:  04/28/2010 FINDINGS: There is no evidence of fracture or dislocation. There is no evidence of arthropathy or other focal bone abnormality. Soft tissues are unremarkable. Small calcaneal spur. IMPRESSION: No acute osseous abnormality. Electronically Signed   By: Jasmine Pang M.D.   On: 09/26/2016 22:10    Procedures Procedures (including critical care time)  Medications Ordered in ED Medications - No data to display   Initial Impression / Assessment and Plan / ED Course  I have reviewed the triage vital signs and the nursing notes.  Pertinent labs & imaging results that were available during my care of the patient were reviewed by me and considered in my medical decision making (see chart for details).     Patient presents to ED for evaluation of right ankle, foot and wrist pain that occurred after mechanical fall prior to arrival. She states that she was running up stairs when she tripped and fell. She denies any head injury or loss of  consciousness. On physical exam she does have tenderness to palpation of the lateral right ankle but there is no significant edema, temperature color change noted. She reports previous history of possible fracture or dislocation this foot and she is unsure if it was the right or left foot. She has no sensory deficit and strength is 5/5 in bilateral upper and lower extremities. There is pain with flexion of the right wrist. X-rays of the wrist, ankle and foot were negative for acute osseous abnormality. I advised patient to wear the wrist splint that she has at home and will provide ankle brace here in the ED. Patient declined crutches at this time. I encouraged her to follow up with orthopedics for further evaluation and to weight-bear as tolerated. Advised to take ibuprofen or Tylenol as needed for pain. Patient appears stable for discharge at this  time. Strict return precautions given.  Final Clinical Impressions(s) / ED Diagnoses   Final diagnoses:  Acute right ankle pain  Right wrist pain    New Prescriptions New Prescriptions   No medications on file     Brooks SailorsKhatri, Daishia Fetterly, PA-C 09/26/16 2235    Lorre NickAllen, Anthony, MD 09/26/16 2314

## 2016-09-26 NOTE — ED Triage Notes (Signed)
Pt presents with c/o fall that occurred today. Pt reports she fell up the stairs. Pt c/o right foot pain, right ankle pain, and right wrist pain. Ambulatory to triage.

## 2017-02-18 ENCOUNTER — Ambulatory Visit (INDEPENDENT_AMBULATORY_CARE_PROVIDER_SITE_OTHER): Payer: Self-pay | Admitting: Family Medicine

## 2017-02-18 ENCOUNTER — Encounter: Payer: Self-pay | Admitting: Family Medicine

## 2017-02-18 VITALS — BP 110/72 | HR 74 | Temp 97.6°F | Ht 62.0 in | Wt 183.3 lb

## 2017-02-18 DIAGNOSIS — Z720 Tobacco use: Secondary | ICD-10-CM

## 2017-02-18 DIAGNOSIS — J329 Chronic sinusitis, unspecified: Secondary | ICD-10-CM

## 2017-02-18 DIAGNOSIS — H6982 Other specified disorders of Eustachian tube, left ear: Secondary | ICD-10-CM

## 2017-02-18 MED ORDER — BUPROPION HCL ER (SR) 150 MG PO TB12: 150.0000 mg | ORAL_TABLET | Freq: Every day | ORAL | 0 refills | Status: DC

## 2017-02-18 NOTE — Progress Notes (Signed)
HPI:  Acute visit for respiratory illness: -started:yesterday -symptoms:nasal congestion, PND, sneezing, cough, L ear discomfort -denies:fever, SOB, NVD, tooth pain, body aches -has tried: noting -sick contacts/travel/risks: no reported flu, strep or tick exposure -Hx of: smoking and seasonal allergies  Tobacco use: -Smokes 1 pack/day -She has quit in the past cold [place] -He is interested in quitting again -Tried Wellbutrin in the past would like to try this again, reports tolerated it well  ROS: See pertinent positives and negatives per HPI.  Past Medical History:  Diagnosis Date  . Abscess   . Anxiety   . Chronic back pain     Past Surgical History:  Procedure Laterality Date  . TONSILLECTOMY AND ADENOIDECTOMY    . WISDOM TOOTH EXTRACTION      Family History  Problem Relation Age of Onset  . Hypothyroidism Mother   . Stroke Maternal Grandmother   . Hypertension Maternal Grandfather   . Heart disease Maternal Grandfather     Social History   Socioeconomic History  . Marital status: Single    Spouse name: None  . Number of children: 0  . Years of education: 4415  . Highest education level: None  Social Needs  . Financial resource strain: None  . Food insecurity - worry: None  . Food insecurity - inability: None  . Transportation needs - medical: None  . Transportation needs - non-medical: None  Occupational History  . Occupation: Furniture conservator/restorer[employer]  Tobacco Use  . Smoking status: Current Every Day Smoker    Packs/day: 0.50    Years: 20.00    Pack years: 10.00    Types: Cigarettes  . Smokeless tobacco: Never Used  Substance and Sexual Activity  . Alcohol use: Yes    Alcohol/week: 0.0 oz    Comment: occasionally  . Drug use: No  . Sexual activity: Yes    Birth control/protection: Pill  Other Topics Concern  . None  Social History Narrative   [place], [place]. Currently resides in a house with her mother. 5 dogs. Fun:  Works all the time.    Denies religious beliefs that would effect health care.      Current Outpatient Medications:  .  ibuprofen (ADVIL,MOTRIN) 200 MG tablet, Take 400 mg by mouth every 6 (six) hours as needed for moderate pain. , Disp: , Rfl:  .  buPROPion (WELLBUTRIN SR) 150 MG 12 hr tablet, Take 1 tablet (150 mg total) by mouth daily., Disp: 90 tablet, Rfl: 0  EXAM:  Vitals:   02/18/17 0843  BP: 110/72  Pulse: 74  Temp: 97.6 F (36.4 C)    Body mass index is 33.53 kg/m.  GENERAL: vitals reviewed and listed above, alert, oriented, appears well hydrated and in no acute distress  HEENT: atraumatic, conjunttiva clear, no obvious abnormalities on inspection of external nose and ears, normal appearance of ear canals and TMs except for a clear effusion on the left, clear nasal congestion, mild post oropharyngeal erythema with PND, no tonsillar edema or exudate, no sinus TTP  NECK: no obvious masses on inspection  LUNGS: clear to auscultation bilaterally, no wheezes, rales or rhonchi, good air movement,  CV: HRRR, no peripheral edema  MS: moves all extremities without noticeable abnormality  PSYCH: pleasant and cooperative, no obvious depression or anxiety  ASSESSMENT AND PLAN:  Discussed the following assessment and plan:  Rhinosinusitis  Dysfunction of left eustachian tube  Tobacco use  -given HPI and exam findings today, a  serious infection or illness is unlikely. We discussed potential etiologies, with VURI or allergic rhinitis with eustachian tube dysfunction being most likely, and advised supportive care and monitoring. We discussed treatment side effects, likely course, antibiotic misuse, transmission, and signs of developing a serious illness.  Given her history of allergies will do Afrin for a few days then will do an allergy regimen of Flonase Claritin. -Smoking cessation counseling for about 5 minutes, ascend and Wellbutrin for her to use -She needs to see a new  PCP as her PCP is no longer available -provided her with a list of providers taking new patients here in requested that she schedule a new patient visit a month -of course, we advised to return or notify a doctor immediately if symptoms worsen or persist or new concerns arise.    Patient Instructions  BEFORE YOU LEAVE: -follow up: Scheduled new patient visit with Dr. [place], Dr. Salomon FickBanks, Dr. Clent RidgesFry or Kandee Keenory in about 1 month  For the ear issues: -Afrin nasal spray twice daily for 4 days.  Do not use longer than 4 days. -Flonase 2 sprays each nostril daily for 1 month. -Claritin once daily -Massage I hope you are feeling better soon! Seek care promptly if your symptoms worsen, new concerns arise or you are not improving with treatment.   For the smoking: -I am so glad that you want to quit! -Start the Wellbutrin today and take once daily for 90 days -Quit smoking completely in 1 week!  You will still have cravings and this will still be tough, but you can do it and the benefits are so great!    Kriste BasqueKIM, HANNAH R., DO

## 2017-02-18 NOTE — Patient Instructions (Signed)
BEFORE YOU LEAVE: -follow up: Scheduled new patient visit with Dr. [place], Dr. Salomon FickBanks, Dr. Clent RidgesFry or Kandee Keenory in about 1 month  For the ear issues: -Afrin nasal spray twice daily for 4 days.  Do not use longer than 4 days. -Flonase 2 sprays each nostril daily for 1 month. -Claritin once daily -Massage I hope you are feeling better soon! Seek care promptly if your symptoms worsen, new concerns arise or you are not improving with treatment.   For the smoking: -I am so glad that you want to quit! -Start the Wellbutrin today and take once daily for 90 days -Quit smoking completely in 1 week!  You will still have cravings and this will still be tough, but you can do it and the benefits are so great!

## 2019-08-09 ENCOUNTER — Other Ambulatory Visit: Payer: Self-pay

## 2019-08-09 ENCOUNTER — Encounter: Payer: Self-pay | Admitting: Emergency Medicine

## 2019-08-09 ENCOUNTER — Ambulatory Visit
Admission: EM | Admit: 2019-08-09 | Discharge: 2019-08-09 | Disposition: A | Discharge status: Home / Self Care | Payer: 59 | Attending: Emergency Medicine | Admitting: Emergency Medicine

## 2019-08-09 DIAGNOSIS — H6592 Unspecified nonsuppurative otitis media, left ear: Secondary | ICD-10-CM | POA: Diagnosis not present

## 2019-08-09 DIAGNOSIS — J302 Other seasonal allergic rhinitis: Secondary | ICD-10-CM

## 2019-08-09 MED ORDER — FLUTICASONE PROPIONATE 50 MCG/ACT NA SUSP: 1.0000 | Freq: Every day | NASAL | 0 refills | Status: DC

## 2019-08-09 MED ORDER — BENZONATATE 100 MG PO CAPS: 100.0000 mg | ORAL_CAPSULE | Freq: Three times a day (TID) | ORAL | 0 refills | Status: DC

## 2019-08-09 MED ORDER — PREDNISONE 10 MG PO TABS: 20.0000 mg | ORAL_TABLET | Freq: Every day | ORAL | 0 refills | Status: DC

## 2019-08-09 NOTE — ED Triage Notes (Signed)
Pt here for nasal congestion, cough and ear pain x 2.5 weeks denies fever

## 2019-08-09 NOTE — Discharge Instructions (Addendum)
Flonase, Tessalon, and prednisone were prescribed Take as directed Continue to take Zyrtec Follow-up with PCP Return for worsening of symptoms

## 2019-08-09 NOTE — ED Provider Notes (Signed)
[facility]    CSN: [id number] Arrival date & time: 08/09/19  1550      History   Chief Complaint Chief Complaint  Patient presents with  . Nasal Congestion    HPI Breanna Garcia is a 39 y.o. female.   Presented to the urgent care for complaint of nasal congestion, cough and ear pain for the past 14 days.  Denies sick exposure to COVID, flu or strep.  Denies recent travel.  Denies aggravating or alleviating symptoms.  Denies previous COVID infection.   Denies fever, chills, fatigue,  rhinorrhea, sore throat,  SOB, wheezing, chest pain, nausea, vomiting, changes in bowel or bladder habits.    The history is provided by the patient. No language interpreter was used.    Past Medical History:  Diagnosis Date  . Abscess   . Anxiety   . Chronic back pain     Patient Active Problem List   Diagnosis Date Noted  . Laceration of arm 07/27/2014  . Routine general medical examination at a health care facility 05/04/2014  . Psoriasis 04/24/2014  . Tobacco use disorder 04/24/2014  . Migraine 04/24/2014    Past Surgical History:  Procedure Laterality Date  . TONSILLECTOMY AND ADENOIDECTOMY    . WISDOM TOOTH EXTRACTION      OB History   No obstetric history on file.      Home Medications    Prior to Admission medications   Medication Sig Start Date End Date Taking? Authorizing Provider  benzonatate (TESSALON) 100 MG capsule Take 1 capsule (100 mg total) by mouth every 8 (eight) hours. 08/09/19   Jeannie Mallinger, Zachery Dakins, FNP  buPROPion (WELLBUTRIN SR) 150 MG 12 hr tablet Take 1 tablet (150 mg total) by mouth daily. 02/18/17   Terressa Koyanagi, DO  fluticasone (FLONASE) 50 MCG/ACT nasal spray Place 1 spray into both nostrils daily for 14 days. 08/09/19 08/23/19  Adriana Quinby, Zachery Dakins, FNP  ibuprofen (ADVIL,MOTRIN) 200 MG tablet Take 400 mg by mouth every 6 (six) hours as needed for moderate pain.     [provider]  predniSONE (DELTASONE) 10 MG tablet Take 2 tablets  (20 mg total) by mouth daily. 08/09/19   AvegnoZachery Dakins, FNP    Family History Family History  Problem Relation Age of Onset  . Hypothyroidism Mother   . Stroke Maternal Grandmother   . Hypertension Maternal Grandfather   . Heart disease Maternal Grandfather     Social History Social History   Tobacco Use  . Smoking status: Current Every Day Smoker    Packs/day: 0.50    Years: 20.00    Pack years: 10.00    Types: Cigarettes  . Smokeless tobacco: Never Used  Substance Use Topics  . Alcohol use: Yes    Alcohol/week: 0.0 standard drinks    Comment: occasionally  . Drug use: No     Allergies   Penicillins   Review of Systems Review of Systems  Constitutional: Negative.   HENT: Positive for congestion and ear pain.   Respiratory: Positive for cough.   Cardiovascular: Negative.   Gastrointestinal: Negative.   Neurological: Negative.      Physical Exam Triage Vital Signs ED Triage Vitals  Enc Vitals Group     BP      Pulse      Resp      Temp      Temp src      SpO2      Weight  Height      Head Circumference      Peak Flow      Pain Score      Pain Loc      Pain Edu?      Excl. in [place]?    No data found.  Updated Vital Signs BP 124/86 (BP Location: Right Arm)   Pulse 95   Temp 98.3 F (36.8 C) (Oral)   Resp 18   SpO2 96%   Visual Acuity Right Eye Distance:   Left Eye Distance:   Bilateral Distance:    Right Eye Near:   Left Eye Near:    Bilateral Near:     Physical Exam Vitals and nursing note reviewed.  Constitutional:      General: She is not in acute distress.    Appearance: Normal appearance. She is normal weight. She is not ill-appearing or toxic-appearing.  HENT:     Head: Normocephalic.     Right Ear: Tympanic membrane, ear canal and external ear normal. There is no impacted cerumen.     Left Ear: Ear canal and external ear normal. A middle ear effusion is present. There is no impacted cerumen.     Nose: Nose normal. No  congestion.     Mouth/Throat:     Mouth: Mucous membranes are moist.     Pharynx: Oropharynx is clear. No oropharyngeal exudate or posterior oropharyngeal erythema.  Cardiovascular:     Rate and Rhythm: Normal rate and regular rhythm.     Pulses: Normal pulses.     Heart sounds: Normal heart sounds. No murmur.  Pulmonary:     Effort: Pulmonary effort is normal. No respiratory distress.     Breath sounds: Normal breath sounds. No wheezing or rhonchi.  Chest:     Chest wall: No tenderness.  Abdominal:     General: Abdomen is flat. Bowel sounds are normal. There is no distension.     Palpations: There is no mass.     Tenderness: There is no abdominal tenderness.  Skin:    Capillary Refill: Capillary refill takes less than 2 seconds.  Neurological:     General: No focal deficit present.     Mental Status: She is alert and oriented to person, place, and time.      UC Treatments / Results  Labs (all labs ordered are listed, but only abnormal results are displayed) Labs Reviewed - No data to display  EKG   Radiology No results found.  Procedures Procedures (including critical care time)  Medications Ordered in UC Medications - No data to display  Initial Impression / Assessment and Plan / UC Course  I have reviewed the triage vital signs and the nursing notes.  Pertinent labs & imaging results that were available during my care of the patient were reviewed by me and considered in my medical decision making (see chart for details).   Patient is stable at discharge.  Flonase, Tessalon and prednisone were prescribed.  She was advised to continue to take Zyrtec as prescribed.  Follow-up with PCP.  Final Clinical Impressions(s) / UC Diagnoses   Final diagnoses:  Seasonal allergies  Fluid level behind tympanic membrane of left ear     Discharge Instructions     Flonase, Tessalon, and prednisone were prescribed Take as directed Continue to take Zyrtec Follow-up with  PCP Return for worsening of symptoms    ED Prescriptions    Medication Sig Dispense Auth. Provider   fluticasone (FLONASE) 50 MCG/ACT nasal  spray Place 1 spray into both nostrils daily for 14 days. 16 g Charlotte Brafford S, FNP   benzonatate (TESSALON) 100 MG capsule Take 1 capsule (100 mg total) by mouth every 8 (eight) hours. 30 capsule Emalina Dubreuil S, FNP   predniSONE (DELTASONE) 10 MG tablet Take 2 tablets (20 mg total) by mouth daily. 15 tablet Ikeisha Blumberg, Zachery Dakins, FNP     PDMP not reviewed this encounter.   Durward Parcel, FNP 08/09/19 1610

## 2019-10-19 ENCOUNTER — Other Ambulatory Visit: Payer: Self-pay

## 2019-10-19 ENCOUNTER — Other Ambulatory Visit: Payer: 59

## 2019-10-19 DIAGNOSIS — Z20822 Contact with and (suspected) exposure to covid-19: Secondary | ICD-10-CM

## 2019-10-20 LAB — SARS-COV-2, NAA 2 DAY TAT

## 2019-10-20 LAB — NOVEL CORONAVIRUS, NAA: SARS-CoV-2, NAA: NOT DETECTED

## 2019-10-26 DIAGNOSIS — F988 Other specified behavioral and emotional disorders with onset usually occurring in childhood and adolescence: Secondary | ICD-10-CM | POA: Insufficient documentation

## 2019-10-26 DIAGNOSIS — J3089 Other allergic rhinitis: Secondary | ICD-10-CM | POA: Insufficient documentation

## 2019-12-15 DIAGNOSIS — Z8616 Personal history of COVID-19: Secondary | ICD-10-CM

## 2019-12-15 HISTORY — DX: Personal history of COVID-19: Z86.16

## 2020-01-04 DIAGNOSIS — Z8742 Personal history of other diseases of the female genital tract: Secondary | ICD-10-CM | POA: Insufficient documentation

## 2020-01-08 ENCOUNTER — Other Ambulatory Visit: Payer: Self-pay | Admitting: Unknown Physician Specialty

## 2020-01-08 ENCOUNTER — Ambulatory Visit (HOSPITAL_COMMUNITY)
Admission: RE | Admit: 2020-01-08 | Discharge: 2020-01-08 | Disposition: A | Discharge status: Home / Self Care | Payer: 59 | Source: Ambulatory Visit | Attending: Pulmonary Disease | Admitting: Pulmonary Disease

## 2020-01-08 ENCOUNTER — Encounter: Payer: Self-pay | Admitting: Unknown Physician Specialty

## 2020-01-08 ENCOUNTER — Telehealth: Payer: Self-pay | Admitting: Unknown Physician Specialty

## 2020-01-08 DIAGNOSIS — E663 Overweight: Secondary | ICD-10-CM | POA: Diagnosis present

## 2020-01-08 DIAGNOSIS — U071 COVID-19: Secondary | ICD-10-CM | POA: Diagnosis present

## 2020-01-08 DIAGNOSIS — F17200 Nicotine dependence, unspecified, uncomplicated: Secondary | ICD-10-CM

## 2020-01-08 MED ORDER — SODIUM CHLORIDE 0.9 % IV SOLN: INTRAVENOUS | Status: DC | PRN

## 2020-01-08 MED ORDER — DIPHENHYDRAMINE HCL 50 MG/ML IJ SOLN: 50.0000 mg | Freq: Once | INTRAMUSCULAR | Status: DC | PRN

## 2020-01-08 MED ORDER — SODIUM CHLORIDE 0.9 % IV SOLN: Freq: Once | INTRAVENOUS | Status: AC

## 2020-01-08 MED ORDER — ALBUTEROL SULFATE HFA 108 (90 BASE) MCG/ACT IN AERS: 2.0000 | INHALATION_SPRAY | Freq: Once | RESPIRATORY_TRACT | Status: DC | PRN

## 2020-01-08 MED ORDER — METHYLPREDNISOLONE SODIUM SUCC 125 MG IJ SOLR: 125.0000 mg | Freq: Once | INTRAMUSCULAR | Status: DC | PRN

## 2020-01-08 MED ORDER — EPINEPHRINE 0.3 MG/0.3ML IJ SOAJ: 0.3000 mg | Freq: Once | INTRAMUSCULAR | Status: DC | PRN

## 2020-01-08 MED ORDER — FAMOTIDINE IN NACL 20-0.9 MG/50ML-% IV SOLN: 20.0000 mg | Freq: Once | INTRAVENOUS | Status: DC | PRN

## 2020-01-08 NOTE — Telephone Encounter (Signed)
I connected by phone with Breanna Garcia on 01/08/2020 at 9:53 AM to discuss the potential use of a new treatment for mild to moderate COVID-19 viral infection in non-hospitalized patients.  This patient is a 39 y.o. female that meets the FDA criteria for Emergency Use Authorization of COVID monoclonal antibody casirivimab/imdevimab or bamlanivimab/eteseviamb.  Has a (+) direct SARS-CoV-2 viral test result  Has mild or moderate COVID-19   Is NOT hospitalized due to COVID-19  Is within 10 days of symptom onset  Has at least one of the high risk factor(s) for progression to severe COVID-19 and/or hospitalization as defined in EUA.  Specific high risk criteria : BMI > 25   I have spoken and communicated the following to the patient or parent/caregiver regarding COVID monoclonal antibody treatment:  1. FDA has authorized the emergency use for the treatment of mild to moderate COVID-19 in adults and pediatric patients with positive results of direct SARS-CoV-2 viral testing who are 11 years of age and older weighing at least 40 kg, and who are at high risk for progressing to severe COVID-19 and/or hospitalization.  2. The significant known and potential risks and benefits of COVID monoclonal antibody, and the extent to which such potential risks and benefits are unknown.  3. Information on available alternative treatments and the risks and benefits of those alternatives, including clinical trials.  4. Patients treated with COVID monoclonal antibody should continue to self-isolate and use infection control measures (e.g., wear mask, isolate, social distance, avoid sharing personal items, clean and disinfect "high touch" surfaces, and frequent handwashing) according to CDC guidelines.   5. The patient or parent/caregiver has the option to accept or refuse COVID monoclonal antibody treatment.  After reviewing this information with the patient, the patient has agreed to receive one of the  available covid 19 monoclonal antibodies and will be provided an appropriate fact sheet prior to infusion. Gabriel Cirri, NP 01/08/2020 9:53 AM  Sx onset 10/20

## 2020-01-08 NOTE — Discharge Instructions (Signed)
COVID-19: How to Protect Yourself and Others Know how it spreads  There is currently no vaccine to prevent coronavirus disease 2019 (COVID-19).  The best way to prevent illness is to avoid being exposed to this virus.  The virus is thought to spread mainly from person-to-person. ? Between people who are in close contact with one another (within about 6 feet). ? Through respiratory droplets produced when an infected person coughs, sneezes or talks. ? These droplets can land in the mouths or noses of people who are nearby or possibly be inhaled into the lungs. ? COVID-19 may be spread by people who are not showing symptoms. Everyone should Clean your hands often  Wash your hands often with soap and water for at least 20 seconds especially after you have been in a public place, or after blowing your nose, coughing, or sneezing.  If soap and water are not readily available, use a hand sanitizer that contains at least 60% alcohol. Cover all surfaces of your hands and rub them together until they feel dry.  Avoid touching your eyes, nose, and mouth with unwashed hands. Avoid close contact  Limit contact with others as much as possible.  Avoid close contact with people who are sick.  Put distance between yourself and other people. ? Remember that some people without symptoms may be able to spread virus. ? This is especially important for people who are at higher risk of getting very sick.www.cdc.gov/coronavirus/2019-ncov/need-extra-precautions/people-at-higher-risk.html Cover your mouth and nose with a mask when around others  You could spread COVID-19 to others even if you do not feel sick.  Everyone should wear a mask in public settings and when around people not living in their household, especially when social distancing is difficult to maintain. ? Masks should not be placed on young children under age 2, anyone who has trouble breathing, or is unconscious, incapacitated or otherwise  unable to remove the mask without assistance.  The mask is meant to protect other people in case you are infected.  Do NOT use a facemask meant for a healthcare worker.  Continue to keep about 6 feet between yourself and others. The mask is not a substitute for social distancing. Cover coughs and sneezes  Always cover your mouth and nose with a tissue when you cough or sneeze or use the inside of your elbow.  Throw used tissues in the trash.  Immediately wash your hands with soap and water for at least 20 seconds. If soap and water are not readily available, clean your hands with a hand sanitizer that contains at least 60% alcohol. Clean and disinfect  Clean AND disinfect frequently touched surfaces daily. This includes tables, doorknobs, light switches, countertops, handles, desks, phones, keyboards, toilets, faucets, and sinks. www.cdc.gov/coronavirus/2019-ncov/prevent-getting-sick/disinfecting-your-home.html  If surfaces are dirty, clean them: Use detergent or soap and water prior to disinfection.  Then, use a household disinfectant. You can see a list of EPA-registered household disinfectants here. cdc.gov/coronavirus 11/16/2018 This information is not intended to replace advice given to you by your health care provider. Make sure you discuss any questions you have with your health care provider. Document Revised: 11/24/2018 Document Reviewed: 09/22/2018 Elsevier Patient Education  2020 Elsevier Inc.   10 Things You Can Do to Manage Your COVID-19 Symptoms at Home If you have possible or confirmed COVID-19: 1. Stay home from work and school. And stay away from other public places. If you must go out, avoid using any kind of public transportation, ridesharing, or taxis. 2. Monitor your   symptoms carefully. If your symptoms get worse, call your healthcare provider immediately. 3. Get rest and stay hydrated. 4. If you have a medical appointment, call the healthcare provider ahead of  time and tell them that you have or may have COVID-19. 5. For medical emergencies, call 911 and notify the dispatch personnel that you have or may have COVID-19. 6. Cover your cough and sneezes with a tissue or use the inside of your elbow. 7. Wash your hands often with soap and water for at least 20 seconds or clean your hands with an alcohol-based hand sanitizer that contains at least 60% alcohol. 8. As much as possible, stay in a specific room and away from other people in your home. Also, you should use a separate bathroom, if available. If you need to be around other people in or outside of the home, wear a mask. 9. Avoid sharing personal items with other people in your household, like dishes, towels, and bedding. 10. Clean all surfaces that are touched often, like counters, tabletops, and doorknobs. Use household cleaning sprays or wipes according to the label instructions. cdc.gov/coronavirus 09/14/2018 This information is not intended to replace advice given to you by your health care provider. Make sure you discuss any questions you have with your health care provider. Document Revised: 02/16/2019 Document Reviewed: 02/16/2019 Elsevier Patient Education  2020 Elsevier Inc.  What types of side effects do monoclonal antibody drugs cause?  Common side effects  In general, the more common side effects caused by monoclonal antibody drugs include: . Allergic reactions, such as hives or itching . Flu-like signs and symptoms, including chills, fatigue, fever, and muscle aches and pains . Nausea, vomiting . Diarrhea . Skin rashes . Low blood pressure   The CDC is recommending patients who receive monoclonal antibody treatments wait at least 90 days before being vaccinated.  Currently, there are no data on the safety and efficacy of mRNA COVID-19 vaccines in persons who received monoclonal antibodies or convalescent plasma as part of COVID-19 treatment. Based on the estimated half-life of  such therapies as well as evidence suggesting that reinfection is uncommon in the 90 days after initial infection, vaccination should be deferred for at least 90 days, as a precautionary measure until additional information becomes available, to avoid interference of the antibody treatment with vaccine-induced immune responses. 

## 2020-01-08 NOTE — Progress Notes (Signed)
  Diagnosis: COVID-19  Physician:Dr Wright  Procedure: [facility] Infusion [facility] Med: bamlanivimab\etesevimab infusion - Provided patient with bamlanimivab\etesevimab fact sheet for patients, parents and caregivers prior to infusion.  Complications: No immediate complications noted.  Discharge: Discharged home   Chalonda Schlatter W 01/08/2020  

## 2020-04-16 DIAGNOSIS — L4050 Arthropathic psoriasis, unspecified: Secondary | ICD-10-CM

## 2020-04-16 HISTORY — DX: Arthropathic psoriasis, unspecified: L40.50

## 2020-04-17 ENCOUNTER — Encounter: Payer: Self-pay | Admitting: Emergency Medicine

## 2020-04-17 ENCOUNTER — Ambulatory Visit
Admission: EM | Admit: 2020-04-17 | Discharge: 2020-04-17 | Disposition: A | Discharge status: Home / Self Care | Payer: 59

## 2020-04-17 ENCOUNTER — Ambulatory Visit (INDEPENDENT_AMBULATORY_CARE_PROVIDER_SITE_OTHER): Payer: 59

## 2020-04-17 DIAGNOSIS — M25522 Pain in left elbow: Secondary | ICD-10-CM

## 2020-04-17 DIAGNOSIS — M79602 Pain in left arm: Secondary | ICD-10-CM
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
## 2020-04-17 NOTE — Discharge Instructions (Addendum)
Radiology interpretation was printed and given to patient Continue to take OTC Tylenol/ibuprofen as needed for pain Follow RICE instruction that is attached Follow-up with PCP Return or go to ED if you develop any new or worsening symptoms

## 2020-05-06 DIAGNOSIS — L4059 Other psoriatic arthropathy: Secondary | ICD-10-CM | POA: Insufficient documentation

## 2021-01-13 ENCOUNTER — Other Ambulatory Visit: Payer: Self-pay

## 2021-01-13 ENCOUNTER — Encounter (HOSPITAL_BASED_OUTPATIENT_CLINIC_OR_DEPARTMENT_OTHER): Payer: Self-pay | Admitting: Orthopedic Surgery

## 2021-01-17 ENCOUNTER — Encounter (HOSPITAL_BASED_OUTPATIENT_CLINIC_OR_DEPARTMENT_OTHER): Payer: Self-pay | Admitting: Orthopedic Surgery

## 2021-01-17 ENCOUNTER — Other Ambulatory Visit: Payer: Self-pay

## 2021-01-17 NOTE — Progress Notes (Signed)
Spoke w/ via phone for pre-op interview--pt Lab needs dos---- urine preg             Lab results------ no COVID test -----patient states asymptomatic no test needed Arrive at ------- 1015 on 01-20-2021 NPO after MN NO Solid Food.  Clear liquids from MN until--- 0915 Med rec completed Medications to take morning of surgery ----- none Diabetic medication ----- n/a Patient instructed no nail polish to be worn day of surgery Patient instructed to bring photo id and insurance card day of surgery Patient aware to have Driver (ride ) / caregiver for 24 hours after surgery -- husband, george patrick Patient Special Instructions ----- n/a  Pre-Op special Istructions ----- this pt was moved from [facility] to [facility] and surgeon changed from dr Orlan Leavens to dr spears.  Sent inbox message to dr spears in epic ,  needed new informed consent order. Patient verbalized understanding of instructions that were given at this phone interview. Patient denies shortness of breath, chest pain, fever, cough at this phone interview.

## 2021-01-19 NOTE — H&P (Signed)
Preoperative History & Physical Exam  Surgeon: Philipp Ovens, MD  Diagnosis: left elbow lateral epicondylitis  Planned Procedure: Procedure(s) (LRB): LEFT ELBOW LATERAL EPICONDYLITIS DEBRIDEMENT AND REPAIR (Left)  History of Present Illness:   Patient is a 40 y.o. female with symptoms consistent with  left elbow lateral epicondylitis who presents for surgical intervention. The risks, benefits and alternatives of surgical intervention were discussed and informed consent was obtained prior to surgery.  Past Medical History:  Past Medical History:  Diagnosis Date   ADHD (attention deficit hyperactivity disorder)    Anxiety    Chronic back pain    Epicondylitis, lateral, left    History of COVID-19 12/2019   per pt very mild symptoms with sense of smell not same since   History of MRSA infection 2012   per pt infected boils several areas on skin-- resolved   Psoriatic arthritis (HCC) 04/2020   rheumotologist-- michelle young PA ([facility])   Wears glasses     Past Surgical History:  Past Surgical History:  Procedure Laterality Date   TONSILLECTOMY AND ADENOIDECTOMY     age 51   WISDOM TOOTH EXTRACTION     age 43    Medications:  Prior to Admission medications   Medication Sig Start Date End Date Taking? Authorizing Provider  Adalimumab (HUMIRA) 40 MG/0.4ML PSKT Inject into the skin every 14 (fourteen) days.   Yes [provider]  amphetamine-dextroamphetamine (ADDERALL XR) 30 MG 24 hr capsule Take 30 mg by mouth daily.   Yes [provider]  fluticasone (FLONASE) 50 MCG/ACT nasal spray Place 1 spray into both nostrils daily for 14 days. Patient taking differently: Place 1 spray into both nostrils daily as needed. 08/09/19 01/17/21 Yes Avegno, Zachery Dakins, FNP  ibuprofen (ADVIL,MOTRIN) 200 MG tablet Take 400 mg by mouth every 6 (six) hours as needed for moderate pain.    Yes [provider]  halobetasol (ULTRAVATE) 0.05 % ointment  APPLY TO AFFECTED AREAS ONCE A DAY WITH OCCLUSION Patient not taking: Reported on 01/17/2021 03/04/20   [provider]    Allergies:  Penicillins  Review of Systems: Negative except per HPI.  Physical Exam: Alert and oriented, NAD Head and neck: no masses, normal alignment CV: pulse intact Pulm: no increased work of breathing, respirations even and unlabored Abdomen: non-distended Extremities: extremities warm and well perfused  LABS: No results found for this or any previous visit (from the past 2160 hour(s)).   Complete History and Physical exam available in the office notes  Gomez Cleverly

## 2021-01-20 ENCOUNTER — Ambulatory Visit (HOSPITAL_BASED_OUTPATIENT_CLINIC_OR_DEPARTMENT_OTHER)
Admission: RE | Admit: 2021-01-20 | Discharge: 2021-01-20 | Disposition: A | Discharge status: Home / Self Care | Payer: 59 | Source: Ambulatory Visit | Attending: Orthopedic Surgery | Admitting: Orthopedic Surgery

## 2021-01-20 ENCOUNTER — Encounter (HOSPITAL_BASED_OUTPATIENT_CLINIC_OR_DEPARTMENT_OTHER): Payer: Self-pay | Admitting: Orthopedic Surgery

## 2021-01-20 ENCOUNTER — Encounter (HOSPITAL_BASED_OUTPATIENT_CLINIC_OR_DEPARTMENT_OTHER)
Admission: RE | Disposition: A | Discharge status: Home / Self Care | Payer: Self-pay | Source: Ambulatory Visit | Attending: Orthopedic Surgery

## 2021-01-20 ENCOUNTER — Ambulatory Visit (HOSPITAL_BASED_OUTPATIENT_CLINIC_OR_DEPARTMENT_OTHER): Payer: 59 | Admitting: Anesthesiology

## 2021-01-20 ENCOUNTER — Other Ambulatory Visit: Payer: Self-pay

## 2021-01-20 DIAGNOSIS — M7712 Lateral epicondylitis, left elbow: Secondary | ICD-10-CM | POA: Insufficient documentation

## 2021-01-20 HISTORY — DX: Nausea with vomiting, unspecified: Z98.890

## 2021-01-20 HISTORY — DX: Nausea with vomiting, unspecified: R11.2

## 2021-01-20 HISTORY — DX: Attention-deficit hyperactivity disorder, unspecified type: F90.9

## 2021-01-20 HISTORY — DX: Arthropathic psoriasis, unspecified: L40.50

## 2021-01-20 HISTORY — DX: Presence of spectacles and contact lenses: Z97.3

## 2021-01-20 HISTORY — DX: Lateral epicondylitis, left elbow: M77.12

## 2021-01-20 HISTORY — PX: ULNAR NERVE TRANSPOSITION: SHX2595

## 2021-01-20 LAB — POCT PREGNANCY, URINE: Preg Test, Ur: NEGATIVE

## 2021-01-20 SURGERY — ULNAR NERVE DECOMPRESSION/TRANSPOSITION
Anesthesia: Monitor Anesthesia Care | Site: Arm Lower | Laterality: Left

## 2021-01-20 MED ORDER — LACTATED RINGERS IV SOLN: INTRAVENOUS | Status: DC

## 2021-01-20 MED ORDER — ONDANSETRON HCL 4 MG/2ML IJ SOLN
INTRAMUSCULAR | Status: AC
  Filled 2021-01-20: qty 2

## 2021-01-20 MED ORDER — PROPOFOL 10 MG/ML IV BOLUS
INTRAVENOUS | Status: DC | PRN
  Administered 2021-01-20: 40 mg via INTRAVENOUS

## 2021-01-20 MED ORDER — CEFAZOLIN SODIUM-DEXTROSE 2-4 GM/100ML-% IV SOLN
INTRAVENOUS | Status: AC
  Filled 2021-01-20: qty 100

## 2021-01-20 MED ORDER — CLINDAMYCIN PHOSPHATE 900 MG/50ML IV SOLN: 900.0000 mg | INTRAVENOUS | Status: DC

## 2021-01-20 MED ORDER — PROPOFOL 500 MG/50ML IV EMUL
INTRAVENOUS | Status: DC | PRN
  Administered 2021-01-20: 125 ug/kg/min via INTRAVENOUS

## 2021-01-20 MED ORDER — FENTANYL CITRATE (PF) 100 MCG/2ML IJ SOLN: 25.0000 ug | INTRAMUSCULAR | Status: DC | PRN

## 2021-01-20 MED ORDER — CEFAZOLIN SODIUM-DEXTROSE 2-4 GM/100ML-% IV SOLN
2.0000 g | INTRAVENOUS | Status: AC
  Administered 2021-01-20: 2 g via INTRAVENOUS

## 2021-01-20 MED ORDER — FENTANYL CITRATE (PF) 100 MCG/2ML IJ SOLN
INTRAMUSCULAR | Status: AC
  Filled 2021-01-20: qty 2

## 2021-01-20 MED ORDER — HYDROCODONE-ACETAMINOPHEN 5-325 MG PO TABS: 1.0000 | ORAL_TABLET | Freq: Four times a day (QID) | ORAL | 0 refills | Status: AC | PRN

## 2021-01-20 MED ORDER — FENTANYL CITRATE (PF) 100 MCG/2ML IJ SOLN
100.0000 ug | Freq: Once | INTRAMUSCULAR | Status: AC
  Administered 2021-01-20: 100 ug via INTRAVENOUS

## 2021-01-20 MED ORDER — CLINDAMYCIN PHOSPHATE 900 MG/50ML IV SOLN
INTRAVENOUS | Status: AC
  Filled 2021-01-20: qty 50

## 2021-01-20 MED ORDER — MIDAZOLAM HCL 2 MG/2ML IJ SOLN
INTRAMUSCULAR | Status: AC
  Filled 2021-01-20: qty 2

## 2021-01-20 MED ORDER — MIDAZOLAM HCL 2 MG/2ML IJ SOLN
2.0000 mg | Freq: Once | INTRAMUSCULAR | Status: AC
  Administered 2021-01-20: 2 mg via INTRAVENOUS

## 2021-01-20 MED ORDER — BUPIVACAINE-EPINEPHRINE (PF) 0.5% -1:200000 IJ SOLN
INTRAMUSCULAR | Status: DC | PRN
  Administered 2021-01-20: 30 mL via PERINEURAL

## 2021-01-20 MED ORDER — PROPOFOL 500 MG/50ML IV EMUL
INTRAVENOUS | Status: AC
  Filled 2021-01-20: qty 50

## 2021-01-20 MED ORDER — ONDANSETRON HCL 4 MG/2ML IJ SOLN
INTRAMUSCULAR | Status: DC | PRN
  Administered 2021-01-20: 4 mg via INTRAVENOUS

## 2021-01-20 MED ORDER — LIDOCAINE 2% (20 MG/ML) 5 ML SYRINGE
INTRAMUSCULAR | Status: DC | PRN
  Administered 2021-01-20: 80 mg via INTRAVENOUS

## 2021-01-20 SURGICAL SUPPLY — 42 items
ADH SKN CLS APL DERMABOND .7 (GAUZE/BANDAGES/DRESSINGS) ×1
BLADE SURG 15 STRL LF DISP TIS (BLADE) ×2 IMPLANT
BLADE SURG 15 STRL SS (BLADE) ×4
BNDG CMPR 9X4 STRL LF SNTH (GAUZE/BANDAGES/DRESSINGS) ×1
BNDG ELASTIC 4X5.8 VLCR STR LF (GAUZE/BANDAGES/DRESSINGS) ×2 IMPLANT
BNDG ESMARK 4X9 LF (GAUZE/BANDAGES/DRESSINGS) ×2 IMPLANT
BNDG PLASTER FAST 3X3 WHT LF (CAST SUPPLIES) ×2 IMPLANT
BNDG PLSTR 3X3 XFST ST WHT LF (CAST SUPPLIES) ×1
CLSR STERI-STRIP ANTIMIC 1/2X4 (GAUZE/BANDAGES/DRESSINGS) ×2 IMPLANT
CORD BIPOLAR FORCEPS 12FT (ELECTRODE) ×2 IMPLANT
COVER BACK TABLE 60X90IN (DRAPES) ×2 IMPLANT
CUFF TOURN SGL QUICK 18X4 (TOURNIQUET CUFF) ×2 IMPLANT
DECANTER SPIKE VIAL GLASS SM (MISCELLANEOUS) IMPLANT
DERMABOND ADVANCED (GAUZE/BANDAGES/DRESSINGS) ×1
DERMABOND ADVANCED .7 DNX12 (GAUZE/BANDAGES/DRESSINGS) ×1 IMPLANT
DRAPE EXTREMITY T 121X128X90 (DISPOSABLE) ×2 IMPLANT
DRAPE OEC MINIVIEW 54X84 (DRAPES) ×2 IMPLANT
DRAPE SHEET LG 3/4 BI-LAMINATE (DRAPES) ×2 IMPLANT
DRAPE SURG 17X23 STRL (DRAPES) ×2 IMPLANT
ELECT REM PT RETURN 9FT ADLT (ELECTROSURGICAL) ×2
ELECTRODE REM PT RTRN 9FT ADLT (ELECTROSURGICAL) ×1 IMPLANT
GAUZE 4X4 16PLY ~~LOC~~+RFID DBL (SPONGE) ×2 IMPLANT
GAUZE SPONGE 4X4 12PLY STRL (GAUZE/BANDAGES/DRESSINGS) ×2 IMPLANT
GAUZE SPONGE 4X4 12PLY STRL LF (GAUZE/BANDAGES/DRESSINGS) ×2 IMPLANT
GLOVE SURG ENC MOIS LTX SZ7.5 (GLOVE) ×2 IMPLANT
GOWN STRL REUS W/TWL LRG LVL3 (GOWN DISPOSABLE) ×2 IMPLANT
NEEDLE HYPO 25X1 1.5 SAFETY (NEEDLE) ×2 IMPLANT
NS IRRIG 1000ML POUR BTL (IV SOLUTION) ×2 IMPLANT
PACK BASIN DAY SURGERY FS (CUSTOM PROCEDURE TRAY) ×2 IMPLANT
PAD CAST 4YDX4 CTTN HI CHSV (CAST SUPPLIES) ×1 IMPLANT
PADDING CAST ABS 4INX4YD NS (CAST SUPPLIES) ×1
PADDING CAST ABS COTTON 4X4 ST (CAST SUPPLIES) ×1 IMPLANT
PADDING CAST COTTON 4X4 STRL (CAST SUPPLIES) ×2
SLING ARM FOAM STRAP LRG (SOFTGOODS) ×2 IMPLANT
STRIP CLOSURE SKIN 1/2X4 (GAUZE/BANDAGES/DRESSINGS) ×2 IMPLANT
SUCTION FRAZIER HANDLE 10FR (MISCELLANEOUS) ×2
SUCTION TUBE FRAZIER 10FR DISP (MISCELLANEOUS) ×1 IMPLANT
SUT MNCRL AB 4-0 PS2 18 (SUTURE) ×2 IMPLANT
SYR 10ML LL (SYRINGE) ×2 IMPLANT
SYR BULB EAR ULCER 2OZ BL STRL (SYRINGE) ×2 IMPLANT
TOWEL OR 17X26 10 PK STRL BLUE (TOWEL DISPOSABLE) ×2 IMPLANT
TRAY DSU PREP LF (CUSTOM PROCEDURE TRAY) ×2 IMPLANT

## 2021-01-20 NOTE — Anesthesia Procedure Notes (Signed)
Anesthesia Regional Block: Supraclavicular block   Pre-Anesthetic Checklist: , timeout performed,  Correct Patient, Correct Site, Correct Laterality,  Correct Procedure, Correct Position, site marked,  Risks and benefits discussed,  Surgical consent,  Pre-op evaluation,  At surgeon's request and post-op pain management  Laterality: Left  Prep: chloraprep       Needles:  Injection technique: Single-shot  Needle Type: Echogenic Stimulator Needle     Needle Length: 9cm  Needle Gauge: 21     Additional Needles:   Procedures:, nerve stimulator,,, ultrasound used (permanent image in chart),,     Nerve Stimulator or Paresthesia:  Response: biceps, triceps, wrist extension, 0.5 mA  Additional Responses:   Narrative:  Start time: 01/20/2021 11:17 AM End time: 01/20/2021 11:25 AM Injection made incrementally with aspirations every 5 mL.  Performed by: Personally  Anesthesiologist: Marcene Duos, MD

## 2021-01-20 NOTE — Anesthesia Preprocedure Evaluation (Signed)
Anesthesia Evaluation  Patient identified by MRN, date of birth, ID band Patient awake    Reviewed: Allergy & Precautions, NPO status , Patient's Chart, lab work & pertinent test results  History of Anesthesia Complications (+) PONV  Airway Mallampati: II  TM Distance: >3 FB Neck ROM: Full    Dental  (+) Dental Advisory Given   Pulmonary Current Smoker,    breath sounds clear to auscultation       Cardiovascular negative cardio ROS   Rhythm:Regular Rate:Normal     Neuro/Psych  Headaches,    GI/Hepatic negative GI ROS, Neg liver ROS,   Endo/Other  negative endocrine ROS  Renal/GU negative Renal ROS     Musculoskeletal  (+) Arthritis ,   Abdominal   Peds  Hematology negative hematology ROS (+)   Anesthesia Other Findings   Reproductive/Obstetrics                             Anesthesia Physical Anesthesia Plan  ASA: 2  Anesthesia Plan: Regional and MAC   Post-op Pain Management:    Induction:   PONV Risk Score and Plan: 2 and Propofol infusion and Treatment may vary due to age or medical condition  Airway Management Planned: Natural Airway and Simple Face Mask  Additional Equipment:   Intra-op Plan:   Post-operative Plan:   Informed Consent: I have reviewed the patients History and Physical, chart, labs and discussed the procedure including the risks, benefits and alternatives for the proposed anesthesia with the patient or authorized representative who has indicated his/her understanding and acceptance.       Plan Discussed with: CRNA  Anesthesia Plan Comments:         Anesthesia Quick Evaluation

## 2021-01-20 NOTE — Op Note (Signed)
OPERATIVE NOTE  DATE OF PROCEDURE: 01/20/2021  SURGEONS:  Primary: Orene Desanctis, MD  PREOPERATIVE DIAGNOSIS: left elbow lateral epicondylitis  POSTOPERATIVE DIAGNOSIS: Same  NAME OF PROCEDURE:   Left elbow lateral epicondylitis debridement and repair  ANESTHESIA: Monitor Anesthesia Care + Regional Block  SKIN PREPARATION: Hibiclens  ESTIMATED BLOOD LOSS: Minimal  IMPLANTS: none  INDICATIONS:  Breanna Garcia is a 40 y.o. female who has the above preoperative diagnosis.  The patient underwent extensive nonoperative treatment and has not made any improvement despite multiple interventions including therapy, injections, splinting.  I discussed with the patient that the mainstay of treatment for this condition is nonoperative however she understands the risks and benefits and the pain is interfering with her quality of life and would like to move forward with surgery.  The patient has decided to proceed with surgical intervention.  Risks, benefits and alternatives of operative management were discussed including, but not limited to, risks of anesthesia complications, infection, pain, persistent symptoms, stiffness, need for future surgery.  The patient understands, agrees and elects to proceed with surgery.    DESCRIPTION OF PROCEDURE: The patient was met in the pre-operative area and their identity was verified.  The operative location and laterality was also verified and marked.  The patient was brought to the OR and was placed supine on the table.  After repeat patient identification with the operative team anesthesia was provided and the patient was prepped and draped in the usual sterile fashion.  A final timeout was performed verifying the correction patient, procedure, location and laterality.  Preoperative antibiotics were provided and then the left upper extremity was elevated and exsanguinated with an Esmarch and the tourniquet inflated to 250 mmHg.  An oblique incision was made just  anterior to the lateral condyle.  Skin and subcutaneous tissues were divided.  Careful hemostasis was obtained with the bipolar electrocautery.  The raphae between the extensor carpi radialis longus and extensor digitorum communis was identified and a longitudinal split was made taking care to protect the underlying lateral collateral ligament.  The underlying extensor carpi radialis brevis tendinosis was identified and this tissue was sharply excised with the lateral condyle with a scalpel.  This tissue was sent for pathology.   All diseased tissue was removed. Just anterior to the lateral condyle the bone was debrided to healthy cancellous bone with a curette. At this time attention was turned to repair.  Using buried 0 Vicryl interrupted sutures the interval was then closed with a watertight closure.  The wound was thoroughly irrigated and the skin and subcutaneous tissue was closed with 4-0 interrupted inverted Monocryl sutures followed by Dermabond and Steri-Strips.  A sterile soft bandage was applied as well as a wrist cock up plaster wrist splint.  The patient tolerated the procedure well and was awoken from anesthesia and brought to PACU for recovery in stable condition.  At the end of the case all counts were correct x2.   Matt Holmes, MD

## 2021-01-20 NOTE — Progress Notes (Signed)
Assisted Dr. Rob Fitzgerald with left, ultrasound guided, interscalene  block. Side rails up, monitors on throughout procedure. See vital signs in flow sheet. Tolerated Procedure well. 

## 2021-01-20 NOTE — Interval H&P Note (Signed)
History and Physical Interval Note:  01/20/2021 10:37 AM  Breanna Garcia  has presented today for surgery, with the diagnosis of left elbow lateral epicondylitis.  The various methods of treatment have been discussed with the patient and family. After consideration of risks, benefits and other options for treatment, the patient has consented to  Procedure(s) with comments: LEFT ELBOW LATERAL EPICONDYLITIS DEBRIDEMENT AND REPAIR (Left) - with local anesthesia needs 60 minutes as a surgical intervention.  The patient's history has been reviewed, patient examined, no change in status, stable for surgery.  I have reviewed the patient's chart and labs.  Questions were answered to the patient's satisfaction.     Gomez Cleverly

## 2021-01-20 NOTE — Transfer of Care (Signed)
Immediate Anesthesia Transfer of Care Note  Patient: Breanna Garcia  Procedure(s) Performed: LEFT ELBOW LATERAL EPICONDYLITIS DEBRIDEMENT AND REPAIR (Left: Arm Lower)  Patient Location: PACU  Anesthesia Type:MAC  Level of Consciousness: awake, alert  and oriented  Airway & Oxygen Therapy: Patient Spontanous Breathing and Patient connected to face mask oxygen  Post-op Assessment: Report given to RN and Post -op Vital signs reviewed and stable  Post vital signs: Reviewed and stable  Last Vitals:  Vitals Value Taken Time  BP 93/59 01/20/21 1300  Temp 36.6 C 01/20/21 1300  Pulse 76 01/20/21 1302  Resp 25 01/20/21 1302  SpO2 99 % 01/20/21 1302  Vitals shown include unvalidated device data.  Last Pain:  Vitals:   01/20/21 1013  TempSrc: Oral  PainSc: 4       Patients Stated Pain Goal: 4 (16/60/63 0160)  Complications: No notable events documented.

## 2021-01-20 NOTE — Discharge Instructions (Addendum)
Orthopaedic Hand Surgery Discharge Instructions  WEIGHT BEARING STATUS: Non weight bearing on operative extremity  DRESSINGS: Please keep your dressing/splint/cast clean and dry until your follow-up appointment. You may shower by placing a waterproof covering over your dressing/splint/cast. Contact your surgeon if your splint/cast gets wet. It will need to be changed to prevent skin breakdown.  PAIN CONTROL: First line medications for post operative pain control are Tylenol (acetaminophen) and Motrin (ibuprofen) if you are able to take these medications. If you have been prescribed a medication these can be taken as breakthrough pain medications. Please note that some narcotic pain medication have acetaminophen added and you should never consume more than 4,000mg  of acetaminophen in 24 hour period. Also please note that if you are given Toradol (ketoralac) you should not take similar medications simultaneously such as ibuprofen.   ICE/ELEVATION: Ice and elevate your injured extremity as needed. Avoid direct contact of ice with skin.  HOME MEDICATIONS: No changes have been made to your home medications.  FOLLOW UP: You will be called after surgery with an appointment date and time, however if you have not received a phone call within 3 days please call during regular office hours at [phone] to schedule a post operative appointment.  Please Seek Medical Attention if: Call MD for: pain or pressure in chest, jaw, arm, back, neck  Call MD for: temperature greater than 101 F for more than 24 hours  Call MD for: difficulty breathing Call MD for: Incision redness, bleeding, drainage  Call MD for: palpitations or feeling that the heart is racing  Call MD for: increased swelling in arm, leg, ankle, or abdomen  Call MD for: lightheadedness, dizziness, fainting Go to ED or call 911 if: chest pain does not go away after 3 nitroglycerin doses taken 5 min apart  Go to ED or call 911 for: any  uncontrolled bleeding  Go to ED or call 911 if: unable to reach physician  Discharge Medications: Allergies as of 01/20/2021       Reactions   Penicillins Hives   Has patient had a PCN reaction causing immediate rash, facial/tongue/throat swelling, SOB or lightheadedness with hypotension: No Has patient had a PCN reaction causing severe rash involving mucus membranes or skin necrosis: No Has patient had a PCN reaction that required hospitalization: No Has patient had a PCN reaction occurring within the last 10 years: No If all of the above answers are "NO", then may proceed with Cephalosporin use.        Medication List     TAKE these medications    amphetamine-dextroamphetamine 30 MG 24 hr capsule Commonly known as: ADDERALL XR Take 30 mg by mouth daily.   fluticasone 50 MCG/ACT nasal spray Commonly known as: FLONASE Place 1 spray into both nostrils daily for 14 days. What changed:  when to take this reasons to take this   halobetasol 0.05 % ointment Commonly known as: ULTRAVATE APPLY TO AFFECTED AREAS ONCE A DAY WITH OCCLUSION   Humira 40 MG/0.4ML Pskt Generic drug: Adalimumab Inject into the skin every 14 (fourteen) days.   HYDROcodone-acetaminophen 5-325 MG tablet Commonly known as: NORCO/VICODIN Take 1 tablet by mouth every 6 (six) hours as needed for up to 3 days for moderate pain.   ibuprofen 200 MG tablet Commonly known as: ADVIL Take 400 mg by mouth every 6 (six) hours as needed for moderate pain.   pseudoephedrine-acetaminophen 30-500 MG Tabs tablet Commonly known as: TYLENOL SINUS Take 2 tablets by mouth every 4 (four)  hours as needed.          Mathis Dad, MD Orthopaedic Hand Surgeon [facility] Office number: [phone] [address], [address] [place], [place] [address]   Post Anesthesia Home Care Instructions  Activity: Get plenty of rest for the remainder of the day. A responsible individual must stay with you for 24 hours  following the procedure.  For the next 24 hours, DO NOT: -Drive a car -Advertising copywriter -Drink alcoholic beverages -Take any medication unless instructed by your physician -Make any legal decisions or sign important papers.  Meals: Start with liquid foods such as gelatin or soup. Progress to regular foods as tolerated. Avoid greasy, spicy, heavy foods. If nausea and/or vomiting occur, drink only clear liquids until the nausea and/or vomiting subsides. Call your physician if vomiting continues.  Special Instructions/Symptoms: Your throat may feel dry or sore from the anesthesia or the breathing tube placed in your throat during surgery. If this causes discomfort, gargle with warm salt water. The discomfort should disappear within 24 hours.  Regional Anesthesia Blocks  1. Numbness or the inability to move the "blocked" extremity may last from 3-48 hours after placement. The length of time depends on the medication injected and your individual response to the medication. If the numbness is not going away after 48 hours, call your surgeon.  2. The extremity that is blocked will need to be protected until the numbness is gone and the  Strength has returned. Because you cannot feel it, you will need to take extra care to avoid injury. Because it may be weak, you may have difficulty moving it or using it. You may not know what position it is in without looking at it while the block is in effect.  3. For blocks in the legs and feet, returning to weight bearing and walking needs to be done carefully. You will need to wait until the numbness is entirely gone and the strength has returned. You should be able to move your leg and foot normally before you try and bear weight or walk. You will need someone to be with you when you first try to ensure you do not fall and possibly risk injury.  4. Bruising and tenderness at the needle site are common side effects and will resolve in a few days.  5.  Persistent numbness or new problems with movement should be communicated to the surgeon or the [facility] [phone] [facility] [phone]).

## 2021-01-21 ENCOUNTER — Encounter (HOSPITAL_BASED_OUTPATIENT_CLINIC_OR_DEPARTMENT_OTHER): Payer: Self-pay | Admitting: Orthopedic Surgery

## 2021-01-21 LAB — SURGICAL PATHOLOGY

## 2021-01-21 NOTE — Anesthesia Postprocedure Evaluation (Signed)
Anesthesia Post Note  Patient: Breanna Garcia  Procedure(s) Performed: LEFT ELBOW LATERAL EPICONDYLITIS DEBRIDEMENT AND REPAIR (Left: Arm Lower)     Patient location during evaluation: PACU Anesthesia Type: Regional Level of consciousness: awake and alert Pain management: pain level controlled Vital Signs Assessment: post-procedure vital signs reviewed and stable Respiratory status: spontaneous breathing, nonlabored ventilation, respiratory function stable and patient connected to nasal cannula oxygen Cardiovascular status: stable and blood pressure returned to baseline Postop Assessment: no apparent nausea or vomiting Anesthetic complications: no   No notable events documented.  Last Vitals:  Vitals:   01/20/21 1315 01/20/21 1349  BP: 100/62 108/74  Pulse: 70 65  Resp: 19 16  Temp: (!) 36.4 C 36.6 C  SpO2: 96% 100%    Last Pain:  Vitals:   01/20/21 1349  TempSrc:   PainSc: 0-No pain                 Kennieth Rad

## 2021-02-27 DIAGNOSIS — G9332 Myalgic encephalomyelitis/chronic fatigue syndrome: Secondary | ICD-10-CM | POA: Insufficient documentation

## 2021-03-10 ENCOUNTER — Emergency Department (HOSPITAL_COMMUNITY)
Admission: EM | Admit: 2021-03-10 | Discharge: 2021-03-10 | Disposition: A | Discharge status: Home / Self Care | Payer: 59 | Attending: Emergency Medicine | Admitting: Emergency Medicine

## 2021-03-10 ENCOUNTER — Emergency Department (HOSPITAL_COMMUNITY): Payer: 59

## 2021-03-10 ENCOUNTER — Encounter (HOSPITAL_COMMUNITY): Payer: Self-pay

## 2021-03-10 DIAGNOSIS — F17210 Nicotine dependence, cigarettes, uncomplicated: Secondary | ICD-10-CM | POA: Insufficient documentation

## 2021-03-10 DIAGNOSIS — N9489 Other specified conditions associated with female genital organs and menstrual cycle: Secondary | ICD-10-CM | POA: Diagnosis not present

## 2021-03-10 DIAGNOSIS — Z8616 Personal history of COVID-19: Secondary | ICD-10-CM | POA: Diagnosis not present

## 2021-03-10 DIAGNOSIS — Z20822 Contact with and (suspected) exposure to covid-19: Secondary | ICD-10-CM | POA: Diagnosis not present

## 2021-03-10 DIAGNOSIS — R1012 Left upper quadrant pain: Secondary | ICD-10-CM | POA: Diagnosis present

## 2021-03-10 DIAGNOSIS — N12 Tubulo-interstitial nephritis, not specified as acute or chronic: Secondary | ICD-10-CM | POA: Insufficient documentation

## 2021-03-10 LAB — CBC WITH DIFFERENTIAL/PLATELET
Abs Immature Granulocytes: 0.03 10*3/uL (ref 0.00–0.07)
Basophils Absolute: 0.1 10*3/uL (ref 0.0–0.1)
Basophils Relative: 0 %
Eosinophils Absolute: 0.1 10*3/uL (ref 0.0–0.5)
Eosinophils Relative: 0 %
HCT: 37 % (ref 36.0–46.0)
Hemoglobin: 12.3 g/dL (ref 12.0–15.0)
Immature Granulocytes: 0 %
Lymphocytes Relative: 12 %
Lymphs Abs: 1.8 10*3/uL (ref 0.7–4.0)
MCH: 30.5 pg (ref 26.0–34.0)
MCHC: 33.2 g/dL (ref 30.0–36.0)
MCV: 91.8 fL (ref 80.0–100.0)
Monocytes Absolute: 1.4 10*3/uL — ABNORMAL HIGH (ref 0.1–1.0)
Monocytes Relative: 9 %
Neutro Abs: 12.1 10*3/uL — ABNORMAL HIGH (ref 1.7–7.7)
Neutrophils Relative %: 79 %
Platelets: 369 10*3/uL (ref 150–400)
RBC: 4.03 MIL/uL (ref 3.87–5.11)
RDW: 11.9 % (ref 11.5–15.5)
WBC: 15.4 10*3/uL — ABNORMAL HIGH (ref 4.0–10.5)
nRBC: 0 % (ref 0.0–0.2)

## 2021-03-10 LAB — I-STAT BETA HCG BLOOD, ED (MC, WL, AP ONLY): I-stat hCG, quantitative: <5 m[IU]/mL (ref <5)

## 2021-03-10 LAB — COMPREHENSIVE METABOLIC PANEL
ALT: 17 U/L (ref 0–44)
AST: 13 U/L — ABNORMAL LOW (ref 15–41)
Albumin: 3.4 g/dL — ABNORMAL LOW (ref 3.5–5.0)
Alkaline Phosphatase: 78 U/L (ref 38–126)
Anion gap: 8 (ref 5–15)
BUN: 8 mg/dL (ref 6–20)
CO2: 21 mmol/L — ABNORMAL LOW (ref 22–32)
Calcium: 8.4 mg/dL — ABNORMAL LOW (ref 8.9–10.3)
Chloride: 101 mmol/L (ref 98–111)
Creatinine, Ser: 0.89 mg/dL (ref 0.44–1.00)
GFR, Estimated: >60 mL/min (ref >60)
Glucose, Bld: 97 mg/dL (ref 70–99)
Potassium: 3.6 mmol/L (ref 3.5–5.1)
Sodium: 130 mmol/L — ABNORMAL LOW (ref 135–145)
Total Bilirubin: 0.6 mg/dL (ref 0.3–1.2)
Total Protein: 7.2 g/dL (ref 6.5–8.1)

## 2021-03-10 LAB — URINALYSIS, ROUTINE W REFLEX MICROSCOPIC
Bilirubin Urine: NEGATIVE
Glucose, UA: NEGATIVE mg/dL
Ketones, ur: NEGATIVE mg/dL
Nitrite: NEGATIVE
Protein, ur: NEGATIVE mg/dL
Specific Gravity, Urine: <=1.005 (ref 1.005–1.030)
pH: 6 (ref 5.0–8.0)

## 2021-03-10 LAB — RESP PANEL BY RT-PCR (FLU A&B, COVID) ARPGX2
Influenza A by PCR: NEGATIVE
Influenza B by PCR: NEGATIVE
SARS Coronavirus 2 by RT PCR: NEGATIVE

## 2021-03-10 MED ORDER — SODIUM CHLORIDE 0.9 % IV SOLN
1.0000 g | Freq: Once | INTRAVENOUS | Status: AC
  Administered 2021-03-10: 21:00:00 1 g via INTRAVENOUS
  Filled 2021-03-10: qty 10

## 2021-03-10 MED ORDER — IBUPROFEN 600 MG PO TABS: 600.0000 mg | ORAL_TABLET | Freq: Four times a day (QID) | ORAL | 0 refills | Status: DC | PRN

## 2021-03-10 MED ORDER — ONDANSETRON HCL 4 MG PO TABS: 4.0000 mg | ORAL_TABLET | ORAL | 0 refills | Status: DC | PRN

## 2021-03-10 MED ORDER — LACTATED RINGERS IV BOLUS
1000.0000 mL | Freq: Once | INTRAVENOUS | Status: AC
  Administered 2021-03-10: 21:00:00 1000 mL via INTRAVENOUS

## 2021-03-10 MED ORDER — MORPHINE SULFATE (PF) 4 MG/ML IV SOLN
4.0000 mg | Freq: Once | INTRAVENOUS | Status: AC
  Administered 2021-03-10: 21:00:00 4 mg via INTRAVENOUS
  Filled 2021-03-10: qty 1

## 2021-03-10 MED ORDER — CEPHALEXIN 500 MG PO CAPS: 500.0000 mg | ORAL_CAPSULE | Freq: Three times a day (TID) | ORAL | 0 refills | Status: AC

## 2021-03-10 MED ORDER — IBUPROFEN 800 MG PO TABS
800.0000 mg | ORAL_TABLET | Freq: Once | ORAL | Status: AC
  Administered 2021-03-10: 22:00:00 800 mg via ORAL
  Filled 2021-03-10: qty 1

## 2021-03-10 MED ORDER — LACTATED RINGERS IV BOLUS
1000.0000 mL | Freq: Once | INTRAVENOUS | Status: AC
  Administered 2021-03-10: 22:00:00 1000 mL via INTRAVENOUS

## 2021-03-10 MED ORDER — ONDANSETRON HCL 4 MG/2ML IJ SOLN
4.0000 mg | Freq: Once | INTRAMUSCULAR | Status: AC
  Administered 2021-03-10: 21:00:00 4 mg via INTRAVENOUS
  Filled 2021-03-10: qty 2

## 2021-03-10 MED ORDER — HYDROCODONE-ACETAMINOPHEN 5-325 MG PO TABS
1.0000 | ORAL_TABLET | Freq: Once | ORAL | Status: AC
  Administered 2021-03-10: 23:00:00 1 via ORAL
  Filled 2021-03-10: qty 1

## 2021-03-10 MED ORDER — HYDROCODONE-ACETAMINOPHEN 5-325 MG PO TABS: 1.0000 | ORAL_TABLET | ORAL | 0 refills | Status: DC | PRN

## 2021-03-10 MED ORDER — ACETAMINOPHEN 325 MG PO TABS
650.0000 mg | ORAL_TABLET | Freq: Once | ORAL | Status: AC | PRN
  Administered 2021-03-10: 14:00:00 650 mg via ORAL
  Filled 2021-03-10: qty 2

## 2021-03-10 NOTE — ED Provider Notes (Signed)
Emergency Medicine Provider Triage Evaluation Note  Breanna Garcia , a 40 y.o. female  was evaluated in triage.  Pt complains of bilateral flank pain.  Patient reports pain has been present since Tuesday.  Patient has had body aches, fevers and chills over the last 2 days.  Patient complains of discomfort after urination.  States that she was told that she has a kidney infection at urgent care earlier today and to come to the emergency department for further evaluation  Review of Systems  Positive: Flank pain, body aches, fever, chills Negative: Nausea, vomiting, diarrhea, dysuria, hematuria, urinary urgency, vaginal pain, vaginal bleeding, vaginal discharge  Physical Exam  BP 128/86 (BP Location: Right Arm)    Pulse (!) 119    Temp (!) 100.7 F (38.2 C) (Oral)    Resp 16    SpO2 98%  Gen:   Awake, no distress   Resp:  Normal effort  MSK:   Moves extremities without difficulty  Other:  Abdomen soft, nondistended, tenderness to bilateral flanks.  Positive CVA tenderness bilaterally.  No guarding or rebound tenderness.  Medical Decision Making  Medically screening exam initiated at 2:31 PM.  Appropriate orders placed.  Frances D Menzel was informed that the remainder of the evaluation will be completed by another provider, this initial triage assessment does not replace that evaluation, and the importance of remaining in the ED until their evaluation is complete.     Haskel Schroeder, PA-C 03/10/21 1433    Rozelle Logan, DO 03/11/21 1635

## 2021-03-10 NOTE — ED Triage Notes (Signed)
Patient reports that she was seen at [facility] today was told she had a kidney infection and to go to the ED for further evaluation. Patient states she has had a fever, bilateral flank pain L>R, and generalized body aches x 6 days.

## 2021-03-10 NOTE — ED Provider Notes (Signed)
Breanna Garcia Provider Note   CSN: [id number] Arrival date & time: 03/10/21  1237     History Chief Complaint  Patient presents with   Flank Pain   Fever   Generalized Body Aches    Breanna Garcia is a 40 y.o. female.  This is a 40 y.o. female with significant medical history as below, including ADHD, anxiety, psoriatic arthitis who presents to the ED with complaint of abdominal pain, fevers, chills.  Reports onset of symptoms about 5 days ago.  Pain localized to her left flank, left upper quadrant.  She has had poor appetite the last few days, subjective fevers and chills.  Patient was seen in urgent care earlier today and was told that she had a urine infection was sent to the ER for evaluation.  Patient has some mild nausea without vomiting. Pain is sharp, stabbing. Worse with eating but unchanged with liquids. No change to bowel function. She has mildly reduced urination from baseline but no clear dysuria. Denies history of abdominal surgery.   The history is provided by the patient. No language interpreter was used.  Flank Pain Associated symptoms include abdominal pain. Pertinent negatives include no chest pain, no headaches and no shortness of breath.  Fever Associated symptoms: chills and nausea   Associated symptoms: no chest pain, no cough, no diarrhea, no dysuria, no headaches, no rash and no vomiting       Past Medical History:  Diagnosis Date   ADHD (attention deficit hyperactivity disorder)    Anxiety    Chronic back pain    Epicondylitis, lateral, left    History of COVID-19 12/2019   per pt very mild symptoms with sense of smell not same since   History of MRSA infection 2012   per pt infected boils several areas on skin-- resolved   PONV (postoperative nausea and vomiting)    Psoriatic arthritis (HCC) 04/2020   rheumotologist-- michelle young PA ([facility])   Wears glasses     Patient Active Problem List    Diagnosis Date Noted   Laceration of arm 07/27/2014   Routine general medical examination at a health care facility 05/04/2014   Psoriasis 04/24/2014   Tobacco use disorder 04/24/2014   Migraine 04/24/2014    Past Surgical History:  Procedure Laterality Date   TONSILLECTOMY AND ADENOIDECTOMY     age 14   ULNAR NERVE TRANSPOSITION Left 01/20/2021   Procedure: LEFT ELBOW LATERAL EPICONDYLITIS DEBRIDEMENT AND REPAIR;  Surgeon: Gomez Cleverly, MD;  Location: [facility] [place];  Service: Orthopedics;  Laterality: Left;  with local anesthesia needs 60 minutes   WISDOM TOOTH EXTRACTION     age 15     OB History   No obstetric history on file.     Family History  Problem Relation Age of Onset   Hypothyroidism Mother    Stroke Maternal Grandmother    Hypertension Maternal Grandfather    Heart disease Maternal Grandfather     Social History   Tobacco Use   Smoking status: Every Day    Packs/day: 0.50    Years: 20.00    Pack years: 10.00    Types: Cigarettes   Smokeless tobacco: Never  Vaping Use   Vaping Use: Former   Quit date: 11/14/2018  Substance Use Topics   Alcohol use: Not Currently    Comment: occasionally   Drug use: Never    Home Medications Prior to Admission medications   Medication Sig Start Date End  Date Taking? Authorizing Provider  cephALEXin (KEFLEX) 500 MG capsule Take 1 capsule (500 mg total) by mouth 3 (three) times daily for 14 days. 03/10/21 03/24/21 Yes Sloan Leiter, DO  HYDROcodone-acetaminophen (NORCO/VICODIN) 5-325 MG tablet Take 1 tablet by mouth every 4 (four) hours as needed. 03/10/21  Yes Tanda Rockers A, DO  ibuprofen (ADVIL) 600 MG tablet Take 1 tablet (600 mg total) by mouth every 6 (six) hours as needed. 03/10/21  Yes Tanda Rockers A, DO  ondansetron (ZOFRAN) 4 MG tablet Take 1 tablet (4 mg total) by mouth every 4 (four) hours as needed for nausea or vomiting. 03/10/21  Yes Tanda Rockers A, DO  Adalimumab (HUMIRA) 40 MG/0.4ML PSKT  Inject into the skin every 14 (fourteen) days.    [provider]  amphetamine-dextroamphetamine (ADDERALL XR) 30 MG 24 hr capsule Take 30 mg by mouth daily.    [provider]  fluticasone (FLONASE) 50 MCG/ACT nasal spray Place 1 spray into both nostrils daily for 14 days. Patient taking differently: Place 1 spray into both nostrils daily as needed. 08/09/19 01/17/21  Avegno, Zachery Dakins, FNP  halobetasol (ULTRAVATE) 0.05 % ointment APPLY TO AFFECTED AREAS ONCE A DAY WITH OCCLUSION Patient not taking: No sig reported 03/04/20   [provider]  ibuprofen (ADVIL,MOTRIN) 200 MG tablet Take 400 mg by mouth every 6 (six) hours as needed for moderate pain.     [provider]  pseudoephedrine-acetaminophen (TYLENOL SINUS) 30-500 MG TABS tablet Take 2 tablets by mouth every 4 (four) hours as needed.    [provider]    Allergies    Penicillins  Review of Systems   Review of Systems  Constitutional:  Positive for appetite change, chills and fever. Negative for activity change.  HENT:  Negative for facial swelling and trouble swallowing.   Eyes:  Negative for discharge and redness.  Respiratory:  Negative for cough and shortness of breath.   Cardiovascular:  Negative for chest pain and palpitations.  Gastrointestinal:  Positive for abdominal pain and nausea. Negative for constipation, diarrhea and vomiting.  Genitourinary:  Positive for flank pain. Negative for dysuria, vaginal bleeding and vaginal discharge.  Musculoskeletal:  Negative for back pain and gait problem.  Skin:  Negative for pallor and rash.  Neurological:  Negative for syncope and headaches.   Physical Exam Updated Vital Signs BP 102/63 (BP Location: Right Arm)    Pulse 95    Temp 99.1 F (37.3 C) (Oral)    Resp 16    SpO2 92%   Physical Exam Vitals and nursing note reviewed.  Constitutional:      General: She is not in acute distress.    Appearance: Normal appearance.  HENT:      Head: Normocephalic and atraumatic.     Right Ear: External ear normal.     Left Ear: External ear normal.     Nose: Nose normal.     Mouth/Throat:     Mouth: Mucous membranes are moist.  Eyes:     General: No scleral icterus.       Right eye: No discharge.        Left eye: No discharge.  Cardiovascular:     Rate and Rhythm: Normal rate and regular rhythm.     Pulses: Normal pulses.     Heart sounds: Normal heart sounds.  Pulmonary:     Effort: Pulmonary effort is normal. No respiratory distress.     Breath sounds: Normal breath sounds.  Abdominal:  General: Abdomen is flat. Bowel sounds are normal.     Palpations: Abdomen is soft.     Tenderness: There is abdominal tenderness. There is no guarding or rebound.       Comments: Not peritoneal  Musculoskeletal:        General: Normal range of motion.     Cervical back: Normal range of motion.     Right lower leg: No edema.     Left lower leg: No edema.  Skin:    General: Skin is warm and dry.     Capillary Refill: Capillary refill takes less than 2 seconds.  Neurological:     Mental Status: She is alert.  Psychiatric:        Mood and Affect: Mood normal.        Behavior: Behavior normal.    ED Results / Procedures / Treatments   Labs (all labs ordered are listed, but only abnormal results are displayed) Labs Reviewed  URINALYSIS, ROUTINE W REFLEX MICROSCOPIC - Abnormal; Notable for the following components:      Result Value   APPearance HAZY (*)    Hgb urine dipstick MODERATE (*)    Leukocytes,Ua SMALL (*)    Bacteria, UA FEW (*)    All other components within normal limits  COMPREHENSIVE METABOLIC PANEL - Abnormal; Notable for the following components:   Sodium 130 (*)    CO2 21 (*)    Calcium 8.4 (*)    Albumin 3.4 (*)    AST 13 (*)    All other components within normal limits  CBC WITH DIFFERENTIAL/PLATELET - Abnormal; Notable for the following components:   WBC 15.4 (*)    Neutro Abs 12.1 (*)     Monocytes Absolute 1.4 (*)    All other components within normal limits  RESP PANEL BY RT-PCR (FLU A&B, COVID) [id number]  URINE CULTURE  I-STAT BETA HCG BLOOD, ED ([facility], [facility], [facility] ONLY)    EKG None  Radiology CT Renal Stone Study  Result Date: 03/10/2021 CLINICAL DATA:  Bilateral flank pain for 1 week, body aches, fevers and chills for 2 days EXAM: CT ABDOMEN AND PELVIS WITHOUT CONTRAST TECHNIQUE: Multidetector CT imaging of the abdomen and pelvis was performed following the standard protocol without IV contrast. Unenhanced CT was performed per clinician order. Lack of IV contrast limits sensitivity and specificity, especially for evaluation of abdominal/pelvic solid viscera. COMPARISON:  None. FINDINGS: Lower chest: No acute pleural or parenchymal lung disease. Hepatobiliary: No focal liver abnormality is seen. No gallstones, gallbladder wall thickening, or biliary dilatation. Pancreas: Unremarkable unenhanced appearance. Spleen: Unremarkable unenhanced appearance. Adrenals/Urinary Tract: No urinary tract calculi or obstructive uropathy within either kidney. There is bilateral perinephric fat stranding which could reflect underlying pyelonephritis. Evaluation of the renal parenchyma is limited without IV contrast. The bladder is decompressed without gross abnormalities. The adrenals are unremarkable. Stomach/Bowel: No bowel obstruction or ileus. Scattered distal colonic diverticulosis without evidence of acute diverticulitis. Normal appendix right lower quadrant. No bowel wall thickening or inflammatory change. Vascular/Lymphatic: Aortic atherosclerosis. Subcentimeter retroperitoneal lymph nodes are likely reactive. No pathologic adenopathy. Reproductive: Uterus and bilateral adnexa are unremarkable. Other: No free intraperitoneal fluid or free gas. No abdominal wall hernia. Musculoskeletal: No acute or destructive bony lesions. Reconstructed images demonstrate no additional findings. IMPRESSION: 1. Bilateral  perinephric fat stranding, which could reflect underlying pyelonephritis given clinical history. Evaluation of the renal parenchyma is limited without IV contrast. 2. Distal colonic diverticulosis without diverticulitis. 3.  Aortic Atherosclerosis (ICD10-I70.0). Electronically  Signed   By: Sharlet Salina M.D.   On: 03/10/2021 15:57    Procedures .Critical Care Performed by: Sloan Leiter, DO Authorized by: Sloan Leiter, DO   Critical care provider statement:    Critical care time (minutes):  30   Critical care time was exclusive of:  Separately billable procedures and treating other patients   Critical care was necessary to treat or prevent imminent or life-threatening deterioration of the following conditions:  Dehydration   Critical care was time spent personally by me on the following activities:  Development of treatment plan with patient or surrogate, discussions with consultants, evaluation of patient's response to treatment, examination of patient, ordering and review of laboratory studies, ordering and review of radiographic studies, ordering and performing treatments and interventions, pulse oximetry, re-evaluation of patient's condition and review of old charts   Medications Ordered in ED Medications  acetaminophen (TYLENOL) tablet 650 mg (650 mg Oral Given 03/10/21 1414)  cefTRIAXone (ROCEPHIN) 1 g in sodium chloride 0.9 % 100 mL IVPB (0 g Intravenous Stopped 03/10/21 2120)  lactated ringers bolus 1,000 mL (0 mLs Intravenous Stopped 03/10/21 2305)  morphine 4 MG/ML injection 4 mg (4 mg Intravenous Given 03/10/21 2049)  ondansetron (ZOFRAN) injection 4 mg (4 mg Intravenous Given 03/10/21 2051)  lactated ringers bolus 1,000 mL (0 mLs Intravenous Stopped 03/10/21 2148)  ibuprofen (ADVIL) tablet 800 mg (800 mg Oral Given 03/10/21 2208)  HYDROcodone-acetaminophen (NORCO/VICODIN) 5-325 MG per tablet 1 tablet (1 tablet Oral Given 03/10/21 2304)    ED Course  I have reviewed the  triage vital signs and the nursing notes.  Pertinent labs & imaging results that were available during my care of the patient were reviewed by me and considered in my medical decision making (see chart for details).    MDM Rules/Calculators/A&P                          CC: flank pain, nausea  This patient complains of above; this involves an extensive number of treatment options and is a complaint that carries with it a high risk of complications and morbidity. Vital signs were reviewed. Serious etiologies considered.  Record review:  Previous records obtained and reviewed   Additional history obtained from mother  Work up as above, notable for:  Labs & imaging results that were available during my care of the patient were reviewed by me and considered in my medical decision making.  Imaging studies ordered in triage which included CT renal stone and I independently visualized and interpreted imaging which showed possible b/l pyelonephritis  Management: Given IVF, rocephin, morphine/zofran  Reassessment:  Pt reports feeling better, able to tolerate PO intake.   Favor pyelonephritis as etiology of complaints today, urine culture sent, rocephin given in ED, will give abx for home and analgesics for pain control. Close f/u with PCP.   The patient improved significantly and was discharged in stable condition. Detailed discussions were had with the patient regarding current findings, and need for close f/u with PCP or on call doctor. The patient has been instructed to return immediately if the symptoms worsen in any way for re-evaluation. Patient verbalized understanding and is in agreement with current care plan. All questions answered prior to discharge.           This chart was dictated using voice recognition software.  Despite best efforts to proofread,  errors can occur which can change the documentation meaning.  Final Clinical Impression(s) / ED Diagnoses Final  diagnoses:  Pyelonephritis    Rx / DC Orders ED Discharge Orders          Ordered    cephALEXin (KEFLEX) 500 MG capsule  3 times daily        03/10/21 2304    HYDROcodone-acetaminophen (NORCO/VICODIN) 5-325 MG tablet  Every 4 hours PRN        03/10/21 2304    ibuprofen (ADVIL) 600 MG tablet  Every 6 hours PRN        03/10/21 2304    ondansetron (ZOFRAN) 4 MG tablet  Every 4 hours PRN        03/10/21 2305             Tanda Rockers A, DO 03/11/21 0100

## 2021-03-12 LAB — URINE CULTURE: Culture: >=100000 — AB

## 2021-03-13 ENCOUNTER — Telehealth: Payer: Self-pay

## 2021-03-13 NOTE — Telephone Encounter (Signed)
Post ED Visit - Positive Culture Follow-up  Culture report reviewed by antimicrobial stewardship pharmacist: [facility] []  , Pharm.D. []  Enzo Bi, Pharm.D., BCPS AQ-ID []  , Pharm.D., BCPS []  Celedonio Miyamoto, Pharm.D., BCPS []  [place], Garvin Fila.D., BCPS, AAHIVP []  , Pharm.D., BCPS, AAHIVP []  Georgina Pillion, PharmD, BCPS []  , PharmD, BCPS []  [place], PharmD, BCPS []  [place], PharmD []  , PharmD, BCPS []  Estella Husk, PharmD  Pharmacy Team []  Lysle Pearl, PharmD []  , PharmD []  Phillips Climes, PharmD []  , Rph []  Agapito Games) , PharmD []  Verlan Friends, PharmD []  , PharmD []  Mervyn Gay, PharmD []  , PharmD [x]  Vinnie Level, PharmD []  [facility], PharmD []  , PharmD []  Len Childs, PharmD   Positive urine culture Treated with Cephalexin, organism sensitive to the same and no further patient follow-up is required at this time.  Lourie, Retz 03/13/2021, 1:37 PM

## 2022-02-13 DIAGNOSIS — Z419 Encounter for procedure for purposes other than remedying health state, unspecified: Secondary | ICD-10-CM | POA: Diagnosis not present

## 2022-03-16 DIAGNOSIS — Z419 Encounter for procedure for purposes other than remedying health state, unspecified: Secondary | ICD-10-CM | POA: Diagnosis not present

## 2022-04-16 DIAGNOSIS — Z419 Encounter for procedure for purposes other than remedying health state, unspecified: Secondary | ICD-10-CM | POA: Diagnosis not present

## 2022-05-15 DIAGNOSIS — Z419 Encounter for procedure for purposes other than remedying health state, unspecified: Secondary | ICD-10-CM | POA: Diagnosis not present

## 2022-06-15 DIAGNOSIS — Z419 Encounter for procedure for purposes other than remedying health state, unspecified: Secondary | ICD-10-CM | POA: Diagnosis not present

## 2022-07-15 DIAGNOSIS — Z419 Encounter for procedure for purposes other than remedying health state, unspecified: Secondary | ICD-10-CM | POA: Diagnosis not present

## 2022-08-15 DIAGNOSIS — Z419 Encounter for procedure for purposes other than remedying health state, unspecified: Secondary | ICD-10-CM | POA: Diagnosis not present

## 2022-09-14 DIAGNOSIS — Z419 Encounter for procedure for purposes other than remedying health state, unspecified: Secondary | ICD-10-CM | POA: Diagnosis not present

## 2022-10-15 DIAGNOSIS — Z419 Encounter for procedure for purposes other than remedying health state, unspecified: Secondary | ICD-10-CM | POA: Diagnosis not present

## 2022-11-15 DIAGNOSIS — Z419 Encounter for procedure for purposes other than remedying health state, unspecified: Secondary | ICD-10-CM | POA: Diagnosis not present

## 2022-11-17 DIAGNOSIS — H5213 Myopia, bilateral: Secondary | ICD-10-CM | POA: Diagnosis not present

## 2022-12-15 DIAGNOSIS — Z419 Encounter for procedure for purposes other than remedying health state, unspecified: Secondary | ICD-10-CM | POA: Diagnosis not present

## 2023-01-15 DIAGNOSIS — Z419 Encounter for procedure for purposes other than remedying health state, unspecified: Secondary | ICD-10-CM | POA: Diagnosis not present

## 2023-02-14 DIAGNOSIS — Z419 Encounter for procedure for purposes other than remedying health state, unspecified: Secondary | ICD-10-CM | POA: Diagnosis not present

## 2023-03-17 DIAGNOSIS — Z419 Encounter for procedure for purposes other than remedying health state, unspecified: Secondary | ICD-10-CM | POA: Diagnosis not present

## 2023-04-17 DIAGNOSIS — Z419 Encounter for procedure for purposes other than remedying health state, unspecified: Secondary | ICD-10-CM | POA: Diagnosis not present

## 2023-05-15 DIAGNOSIS — Z419 Encounter for procedure for purposes other than remedying health state, unspecified: Secondary | ICD-10-CM | POA: Diagnosis not present

## 2023-06-26 DIAGNOSIS — Z419 Encounter for procedure for purposes other than remedying health state, unspecified: Secondary | ICD-10-CM | POA: Diagnosis not present

## 2023-07-13 IMAGING — CT CT RENAL STONE PROTOCOL
2 of 4 series · 16 of 46 positions shown, 18 images · non-contrast
Comparison: None.

CLINICAL DATA: Bilateral flank pain for 1 week, body aches, fevers
and chills for 2 days

EXAM:
CT ABDOMEN AND PELVIS WITHOUT CONTRAST
TECHNIQUE: Multidetector CT imaging of the abdomen and pelvis was performed
following the standard protocol without IV contrast. Unenhanced CT
was performed per clinician order. Lack of IV contrast limits
sensitivity and specificity, especially for evaluation of
abdominal/pelvic solid viscera.

[Series 2: axial st · axial · 0.79mm/px · z∈[-497,-97]mm · 13 of 92 slices shown, 15 images]
[im 6/92  soft-tissue]
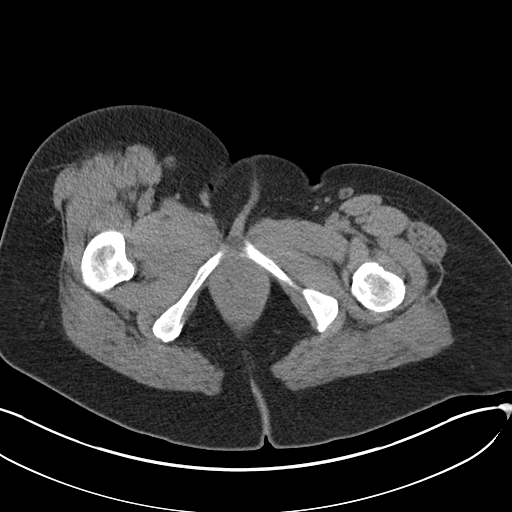
[im 6/92  bone]
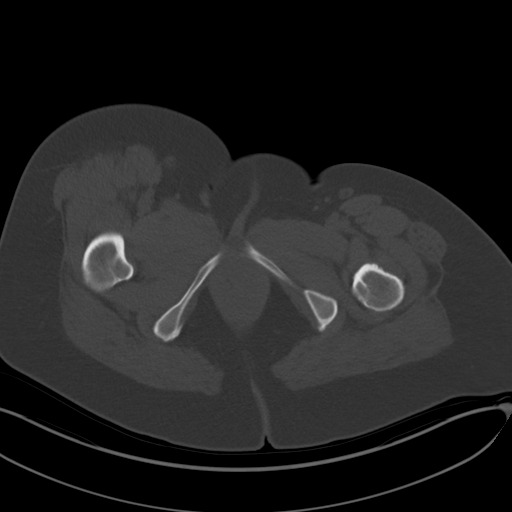
[im 12/92  soft-tissue]
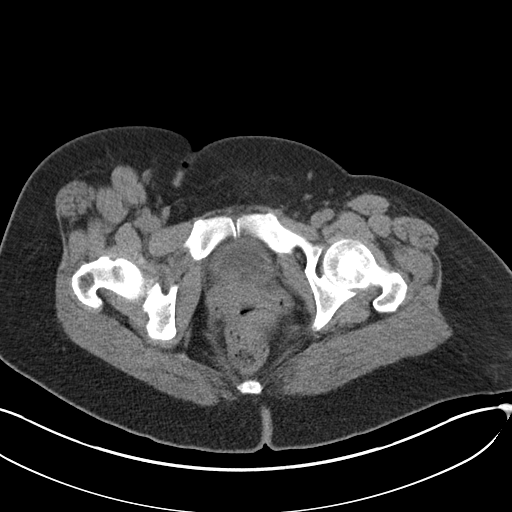
[im 18/92  soft-tissue]
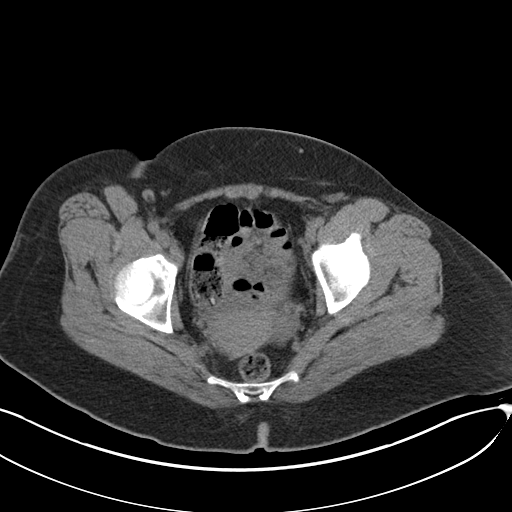
[im 29/92  soft-tissue]
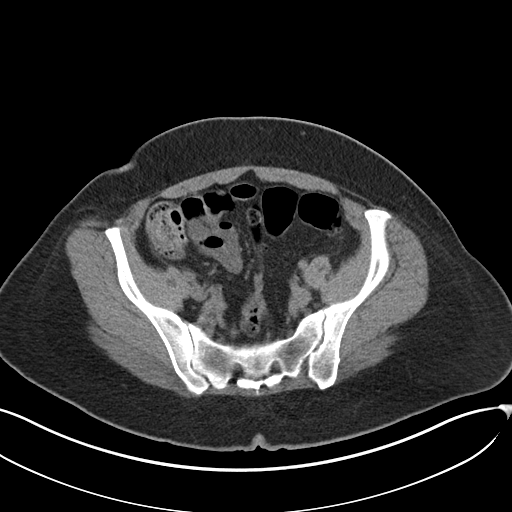
[im 35/92  soft-tissue]
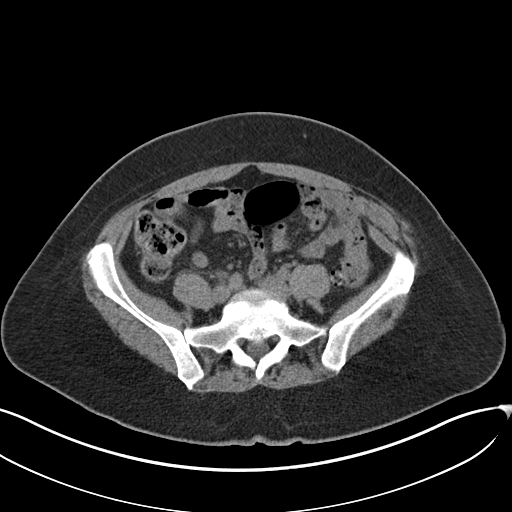
[im 40/92  soft-tissue]
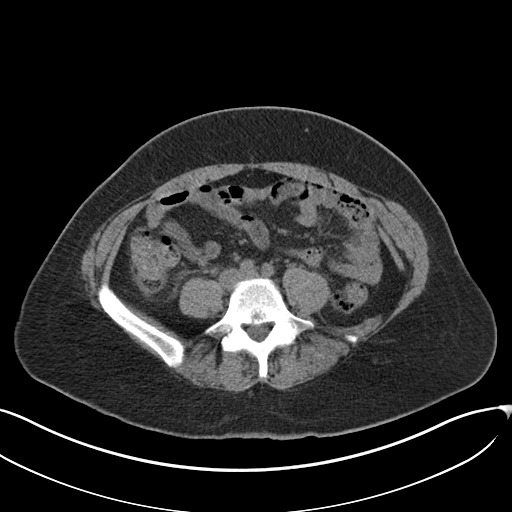
[im 46/92  soft-tissue]
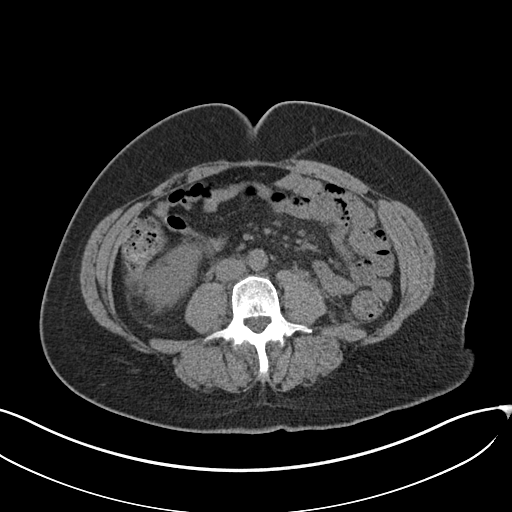
[im 52/92  soft-tissue]
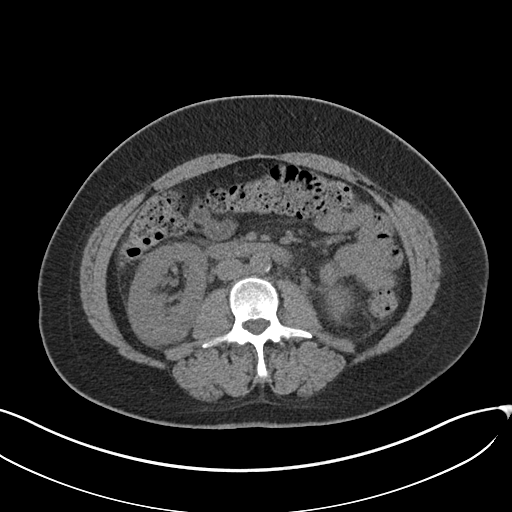
[im 57/92  soft-tissue]
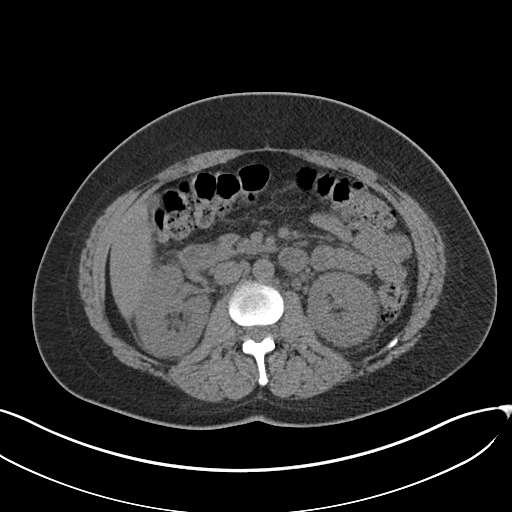
[im 57/92  bone]
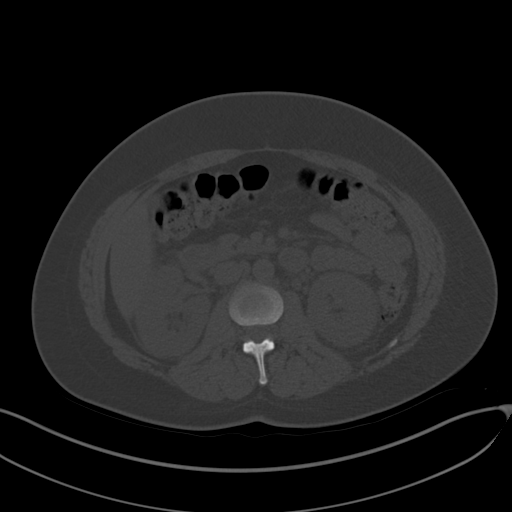
[im 63/92  soft-tissue]
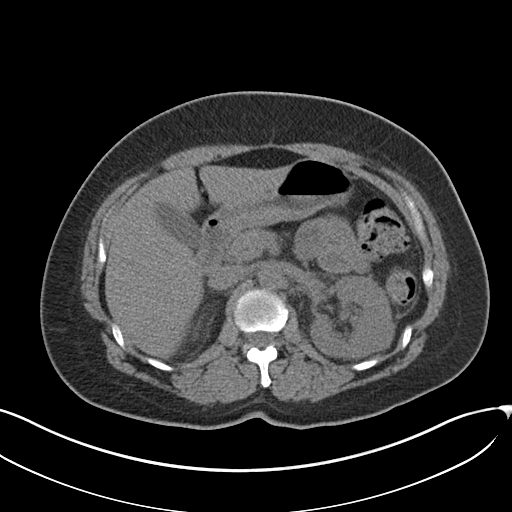
[im 74/92  soft-tissue]
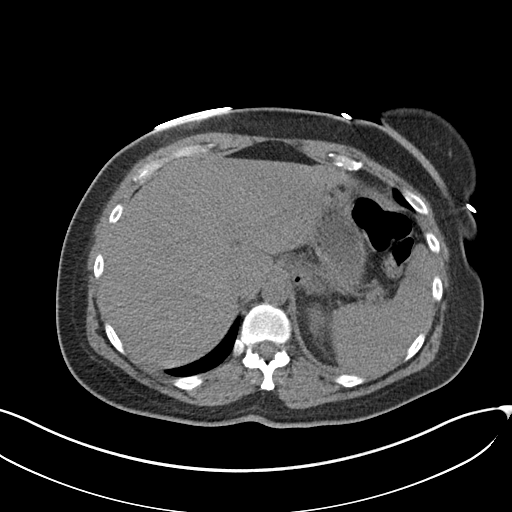
[im 80/92  soft-tissue]
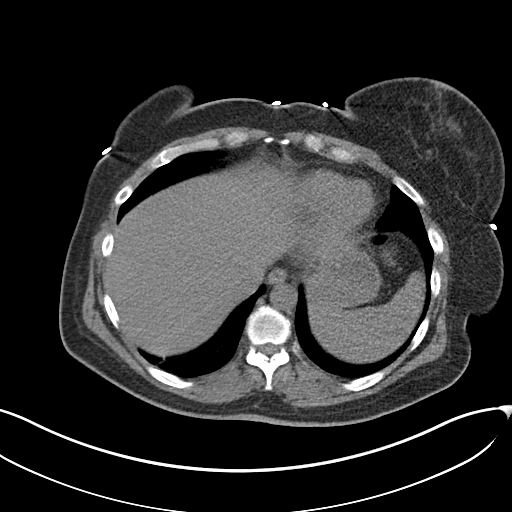
[im 86/92  soft-tissue]
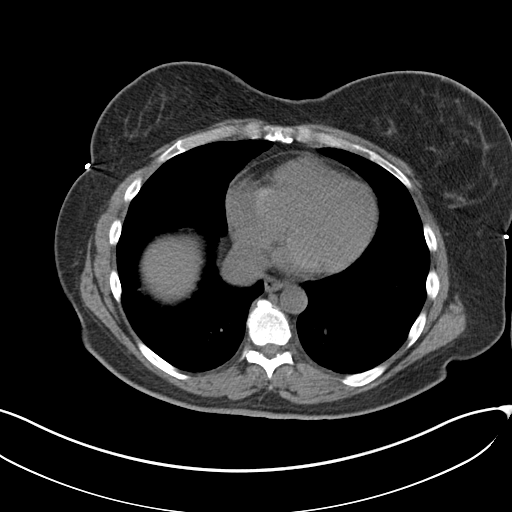

[Series 5: coronal · coronal · 0.77mm/px · 3 of 135 slices shown]
[im 45/135  soft-tissue]
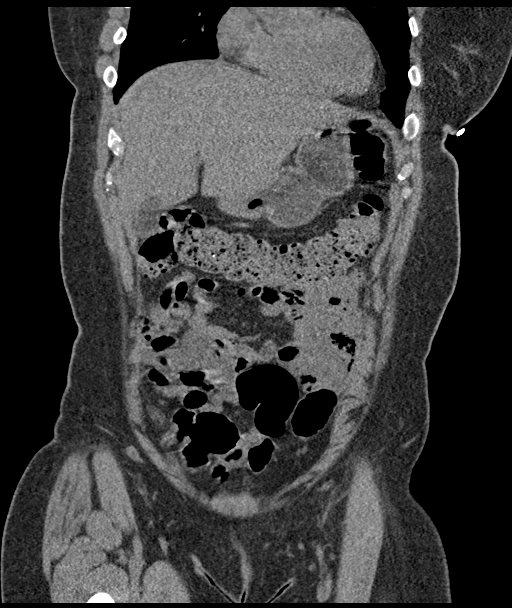
[im 60/135  soft-tissue]
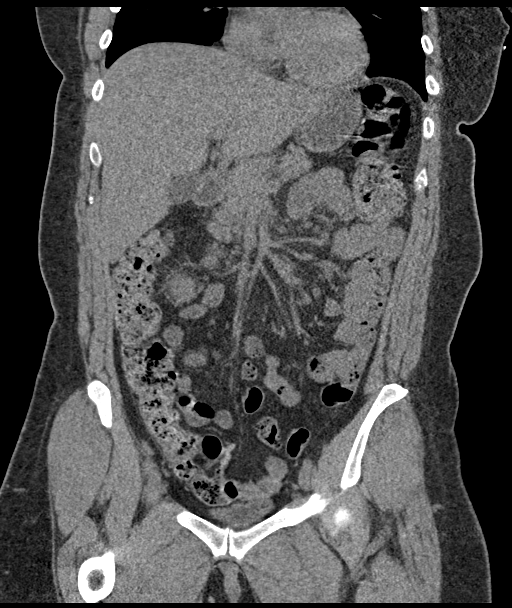
[im 75/135  soft-tissue]
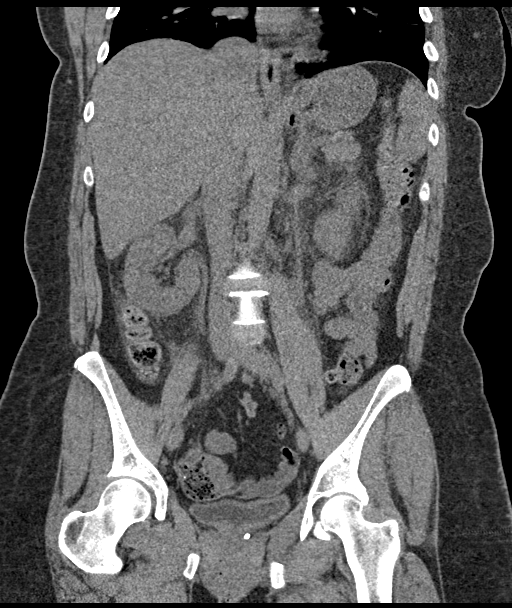

[16 of 46 positions shown; findings below may reference images not displayed]

FINDINGS: Lower chest: No acute pleural or parenchymal lung disease.

Hepatobiliary: No focal liver abnormality is seen. No gallstones,
gallbladder wall thickening, or biliary dilatation.

Pancreas: Unremarkable unenhanced appearance.

Spleen: Unremarkable unenhanced appearance.

Adrenals/Urinary Tract: No urinary tract calculi or obstructive
uropathy within either kidney. There is bilateral perinephric fat
stranding which could reflect underlying pyelonephritis. Evaluation
of the renal parenchyma is limited without IV contrast.

The bladder is decompressed without gross abnormalities. The
adrenals are unremarkable.

Stomach/Bowel: No bowel obstruction or ileus. Scattered distal
colonic diverticulosis without evidence of acute diverticulitis.
Normal appendix right lower quadrant. No bowel wall thickening or
inflammatory change.

Vascular/Lymphatic: Aortic atherosclerosis. Subcentimeter
retroperitoneal lymph nodes are likely reactive. No pathologic
adenopathy.

Reproductive: Uterus and bilateral adnexa are unremarkable.

Other: No free intraperitoneal fluid or free gas. No abdominal wall
hernia.

Musculoskeletal: No acute or destructive bony lesions. Reconstructed
images demonstrate no additional findings.
IMPRESSION: 1. Bilateral perinephric fat stranding, which could reflect
underlying pyelonephritis given clinical history. Evaluation of the
renal parenchyma is limited without IV contrast.
2. Distal colonic diverticulosis without diverticulitis.
3.  Aortic Atherosclerosis ([id number]).

## 2023-07-26 DIAGNOSIS — Z419 Encounter for procedure for purposes other than remedying health state, unspecified: Secondary | ICD-10-CM | POA: Diagnosis not present

## 2023-08-26 DIAGNOSIS — Z419 Encounter for procedure for purposes other than remedying health state, unspecified: Secondary | ICD-10-CM | POA: Diagnosis not present

## 2023-09-25 DIAGNOSIS — Z419 Encounter for procedure for purposes other than remedying health state, unspecified: Secondary | ICD-10-CM | POA: Diagnosis not present

## 2023-10-26 DIAGNOSIS — Z419 Encounter for procedure for purposes other than remedying health state, unspecified: Secondary | ICD-10-CM | POA: Diagnosis not present

## 2023-11-12 HISTORY — PX: FOOT SURGERY: SHX648

## 2023-11-26 DIAGNOSIS — Z419 Encounter for procedure for purposes other than remedying health state, unspecified: Secondary | ICD-10-CM | POA: Diagnosis not present

## 2024-04-02 NOTE — Progress Notes (Signed)
 "  Office Visit Note  Patient: Breanna Garcia             Date of Birth: [birth year]           MRN: [id number]             PCP: [facility] Referring: Juliane Che, PA Visit Date: 04/03/2024 Occupation: Data Unavailable  Subjective:  New Patient (Initial Visit)   Discussed the use of AI scribe software for clinical note transcription with the patient, who gave verbal consent to proceed.  History of Present Illness   Breanna Garcia is a 44 year old female with psoriatic arthritis and psoriasis who presents for rheumatology consultation.  She has been managing psoriatic arthritis with a formal diagnosis made approximately four years ago. Initially, she sought care at urgent care facilities due to the lack of a primary care provider. Her symptoms included pain in the arms and back, which led to a referral to a rheumatologist. She has not been on any medication for her psoriatic arthritis for about two and a half years, having previously been on Humira for over a year. While on Humira, she experienced injection site reactions, including itching and welts, which persisted for two days after each injection. She did not tolerate Benadryl  well. For her arthritis symptoms, she engages in morning stretches and uses heat, Biofreeze creams, and occasionally takes ibuprofen . 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 Today, she feels stiffer than usual, which she attributes to reduced activity the previous day.  She has a history of psoriasis, primarily affecting her feet and occasionally other areas. She was previously prescribed a topical steroid but has not been using it due to personal reasons and time constraints. She has reduced her cigarette smoking significantly over the past two months, prompted after an illness during Christmas.  She experiences chronic dry eyes for which she was  prescribed eye drops by her ophthalmologist, but she discontinued them due to discomfort and vision issues. She also reports alternating episodes of constipation and diarrhea but denies any blood or mucus in her stool. She reports chronic dry eyes for which she was prescribed eye drops by her ophthalmologist, but she discontinued them due to discomfort and vision issues.       Activities of Daily Living:  Patient reports morning stiffness for it depends on the day.   Patient Reports nocturnal pain.  Difficulty dressing/grooming: Reports Difficulty climbing stairs: Reports Difficulty getting out of chair: Reports Difficulty using hands for taps, buttons, cutlery, and/or writing: Reports  Review of Systems  Constitutional:  Positive for fatigue.  HENT:  Positive for mouth sores and mouth dryness.   Eyes:  Positive for dryness.  Respiratory:  Negative for shortness of breath.   Cardiovascular:  Negative for chest pain and palpitations.  Gastrointestinal:  Positive for constipation and diarrhea. Negative for blood in stool.  Endocrine: Negative for increased urination.  Genitourinary:  Negative for involuntary urination.  Musculoskeletal:  Positive for joint pain, gait problem, joint pain, joint swelling, myalgias, muscle weakness, morning stiffness and myalgias. Negative for muscle tenderness.  Skin:  Negative for color change, rash, hair loss and sensitivity to sunlight.  Allergic/Immunologic: Positive for susceptible to infections.  Neurological:  Negative for dizziness and headaches.  Hematological:  Negative for swollen glands.  Psychiatric/Behavioral:  Positive for sleep disturbance. Negative for depressed mood. The patient is not nervous/anxious.  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   Environmental and seasonal allergies 10/26/2019   Laceration of arm 07/27/2014   Routine general medical examination at a health care facility 05/04/2014   Psoriasis 04/24/2014   Tobacco use disorder 04/24/2014   Migraine 04/24/2014   Anxiety state 09/13/2013    Past Medical History:  Diagnosis Date   ADHD (attention deficit hyperactivity disorder)    Anxiety    Chronic back pain    Epicondylitis, lateral, left    History of COVID-19 12/2019   per pt very mild symptoms with sense of smell not same since   History of MRSA infection 2012   per pt infected boils several areas on skin-- resolved   PONV (postoperative nausea and vomiting)    Psoriatic arthritis (HCC) 04/2020   rheumotologist-- michelle young PA ([facility])   Wears glasses     Family History  Problem Relation Age of Onset   Hypothyroidism Mother    Stroke Maternal Grandmother    Hypertension Maternal Grandfather    Heart disease Maternal Grandfather    Past Surgical History:  Procedure Laterality Date   FOOT SURGERY Right 11/12/2023   cyst and bone spur removal   TONSILLECTOMY AND ADENOIDECTOMY     age 49   ULNAR NERVE TRANSPOSITION Left 01/20/2021   Procedure: LEFT ELBOW LATERAL EPICONDYLITIS DEBRIDEMENT AND REPAIR;  Surgeon: Alyse Agent, MD;  Location: [facility];  Service: Orthopedics;  Laterality: Left;  with local anesthesia needs 60 minutes   WISDOM TOOTH EXTRACTION     age 23   Social History[1] Social History   Social History Narrative   Born and raised in [place], [place]. Currently resides in a house with her mother. 5 dogs. Fun: Works all the time.    Denies religious beliefs that would effect health care.      Immunization History  Administered Date(s) Administered   Influenza Inj Mdck Quad Pf 02/04/2021, 03/05/2022   Influenza-Unspecified 04/22/2019, 11/24/2019, 11/28/2019   PFIZER(Purple Top)SARS-COV-2  Vaccination 06/11/2019, 07/03/2019, 04/18/2020   PNEUMOCOCCAL CONJUGATE-20 11/02/2022   PPD Test 10/19/2022   Pfizer(Comirnaty)Fall Seasonal Vaccine 12 years and older 03/05/2022   Pneumococcal Polysaccharide-23 11/24/2019   Tdap 07/06/2013, 07/16/2014   Zoster Recombinant(Shingrix) 11/02/2022, 08/17/2023     Objective: Vital Signs: BP 112/76 (BP Location: Right Arm, Patient Position: Sitting, Cuff Size: Normal)   Pulse 96   Temp 97.7 F (36.5 C)   Resp 14   Ht 5' 3 (1.6 m)   Wt 191 lb 9.6 oz (86.9 kg)   BMI 33.94 kg/m    Physical Exam Eyes:     Conjunctiva/sclera: Conjunctivae normal.  Cardiovascular:     Rate and Rhythm: Normal rate and regular rhythm.  Pulmonary:     Effort: Pulmonary effort is normal.     Breath sounds: Normal breath sounds.  Lymphadenopathy:     Cervical: No cervical adenopathy.  Skin:    General: Skin is warm and dry.     Findings: Rash present.     Comments: Rashes present on right foot Plantar, palmar, pustular psoriasis pattern on feet and hands  Neurological:     Mental Status: She is alert.  Psychiatric:        Mood and Affect: Mood normal.  Musculoskeletal Exam:  Neck full ROM no tenderness Shoulders full ROM no tenderness or swelling Elbows full ROM no tenderness or swelling Wrists full ROM no tenderness or swelling Fingers full ROM no tenderness or swelling, heberdon's nodes Knees full ROM no tenderness or swelling    Investigation: No additional findings.  Imaging: No results found.  Recent Labs: Lab Results  Component Value Date   WBC 4.5 04/03/2024   HGB 13.9 04/03/2024   PLT 418 (H) 04/03/2024   NA 141 04/03/2024   K 4.6 04/03/2024   CL 104 04/03/2024   CO2 30 04/03/2024   GLUCOSE 101 (H) 04/03/2024   BUN 10 04/03/2024   CREATININE 0.63 04/03/2024   BILITOT 0.2 04/03/2024   ALKPHOS 78 03/10/2021   AST 13 04/03/2024   ALT 14 04/03/2024   PROT 7.0 04/03/2024   ALBUMIN 3.4 (L) 03/10/2021   CALCIUM 9.3  04/03/2024   GFRAA  01/31/2007    >60        The eGFR has been calculated using the MDRD equation. This calculation has not been validated in all clinical    Speciality Comments: No specialty comments available.  Procedures:  No procedures performed Allergies: Penicillins   Assessment / Plan:     Visit Diagnoses:  Assessment & Plan Polyarticular psoriatic arthritis (HCC) Chronic condition with joint pain and stiffness. Previous Humira use discontinued due to injection site reactions. No arthritis medication for 2.5 years. Smoking cessation efforts noted. - Ordered blood tests for inflammation markers and baseline labs. - Initiated sulfasalazine  500 mg EC BID, pending medication review. 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 Previous non-compliance with topical steroids. - Prescribed high-intensity topical steroids like clobetasol  or mometasone for palmar/plantar distribution. Orders:   Sedimentation rate   C-reactive protein  High risk medication use Osteoarthritis in multiple areas contributing to joint symptoms. May also benefit with direct antinflamatory effect with SSZ vs alternative NSAIDs. - Continue stretching and heat therapy.  - No serious interval infections. - Checking CBC and CMP for medication monitoring planning to start sulfasalazine   Orders:   CBC with Differential/Platelet   Comprehensive metabolic panel with GFR   Follow-Up Instructions: Return in about 8 weeks (around 05/29/2024) for New pt .   Breanna LELON Ester, MD  Note - This record has been created using [employer].  Chart creation errors have been sought, but may not always  have been located. Such creation errors do not reflect on  the standard of medical care.      [1]  Social History Tobacco Use   Smoking status: Some Days    Current packs/day: 0.50    Average packs/day: 0.5 packs/day for 20.0  years (10.0 ttl pk-yrs)    Types: Cigarettes    Passive exposure: Past   Smokeless tobacco: Never  Vaping Use   Vaping status: Never Used  Substance Use Topics   Alcohol use: Not Currently    Comment: occasionally   Drug use: Never   "

## 2024-04-03 ENCOUNTER — Ambulatory Visit: Attending: Internal Medicine | Admitting: Internal Medicine

## 2024-04-03 ENCOUNTER — Encounter: Payer: Self-pay | Admitting: Internal Medicine

## 2024-04-03 VITALS — BP 112/76 | HR 96 | Temp 97.7°F | Resp 14 | Ht 63.0 in | Wt 191.6 lb

## 2024-04-03 DIAGNOSIS — L409 Psoriasis, unspecified: Secondary | ICD-10-CM

## 2024-04-03 DIAGNOSIS — Z79899 Other long term (current) drug therapy: Secondary | ICD-10-CM

## 2024-04-03 DIAGNOSIS — L4059 Other psoriatic arthropathy: Secondary | ICD-10-CM | POA: Diagnosis not present

## 2024-04-03 NOTE — Assessment & Plan Note (Addendum)
 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 Previous non-compliance with topical steroids. - Prescribed high-intensity topical steroids like clobetasol  or mometasone for palmar/plantar distribution. Orders:   Sedimentation rate   C-reactive protein

## 2024-04-03 NOTE — Assessment & Plan Note (Addendum)
 Chronic condition with joint pain and stiffness. Previous Humira use discontinued due to injection site reactions. No arthritis medication for 2.5 years. Smoking cessation efforts noted. - Ordered blood tests for inflammation markers and baseline labs. - Initiated sulfasalazine  500 mg EC BID, pending medication review. Orders:   Sedimentation rate   C-reactive protein

## 2024-04-04 ENCOUNTER — Telehealth: Payer: Self-pay | Admitting: *Deleted

## 2024-04-04 LAB — COMPREHENSIVE METABOLIC PANEL WITH GFR
AG Ratio: 1.6 (calc) (ref 1.0–2.5)
ALT: 14 U/L (ref 6–29)
AST: 13 U/L (ref 10–35)
Albumin: 4.3 g/dL (ref 3.6–5.1)
Alkaline phosphatase (APISO): 76 U/L (ref 31–125)
BUN: 10 mg/dL (ref 7–25)
CO2: 30 mmol/L (ref 20–32)
Calcium: 9.3 mg/dL (ref 8.6–10.2)
Chloride: 104 mmol/L (ref 98–110)
Creat: 0.63 mg/dL (ref 0.50–0.99)
Globulin: 2.7 g/dL (ref 1.9–3.7)
Glucose, Bld: 101 mg/dL — ABNORMAL HIGH (ref 65–99)
Potassium: 4.6 mmol/L (ref 3.5–5.3)
Sodium: 141 mmol/L (ref 135–146)
Total Bilirubin: 0.2 mg/dL (ref 0.2–1.2)
Total Protein: 7 g/dL (ref 6.1–8.1)
eGFR: 111 mL/min/1.73m2

## 2024-04-04 LAB — CBC WITH DIFFERENTIAL/PLATELET
Absolute Lymphocytes: 1395 {cells}/uL (ref 850–3900)
Absolute Monocytes: 311 {cells}/uL (ref 200–950)
Basophils Absolute: 32 {cells}/uL (ref 0–200)
Basophils Relative: 0.7 %
Eosinophils Absolute: 122 {cells}/uL (ref 15–500)
Eosinophils Relative: 2.7 %
HCT: 42.2 % (ref 35.9–46.0)
Hemoglobin: 13.9 g/dL (ref 11.7–15.5)
MCH: 30 pg (ref 27.0–33.0)
MCHC: 32.9 g/dL (ref 31.6–35.4)
MCV: 91.1 fL (ref 81.4–101.7)
MPV: 9 fL (ref 7.5–12.5)
Monocytes Relative: 6.9 %
Neutro Abs: 2642 {cells}/uL (ref 1500–7800)
Neutrophils Relative %: 58.7 %
Platelets: 418 Thousand/uL — ABNORMAL HIGH (ref 140–400)
RBC: 4.63 Million/uL (ref 3.80–5.10)
RDW: 11.8 % (ref 11.0–15.0)
Total Lymphocyte: 31 %
WBC: 4.5 Thousand/uL (ref 3.8–10.8)

## 2024-04-04 LAB — C-REACTIVE PROTEIN: CRP: 7.5 mg/L

## 2024-04-04 LAB — SEDIMENTATION RATE: Sed Rate: 6 mm/h (ref 0–20)

## 2024-04-04 NOTE — Telephone Encounter (Signed)
 Patient contacted the office and left message asking when the medication you discussed on office with her would be sent to her through my chart.

## 2024-04-15 MED ORDER — CLOBETASOL PROPIONATE 0.05 % EX OINT: 1.0000 | TOPICAL_OINTMENT | Freq: Two times a day (BID) | CUTANEOUS | 0 refills | Status: AC | PRN

## 2024-04-15 MED ORDER — SULFASALAZINE 500 MG PO TBEC: 500.0000 mg | DELAYED_RELEASE_TABLET | Freq: Two times a day (BID) | ORAL | 1 refills | Status: AC

## 2024-04-17 NOTE — Telephone Encounter (Signed)
 Sent patient a mychart message to advise that prescriptions have been sent.

## 2024-04-19 ENCOUNTER — Telehealth: Payer: Self-pay | Admitting: Internal Medicine

## 2024-04-19 NOTE — Telephone Encounter (Signed)
 Contacted patient to see what was going on with her prescriptions since Dr Jeannetta is a medicaid provider. Patient states she was able to get the Sulfasalazine  but not the Clobetasol  ointment due to the pharmacy needing something that she was not aware of.   Contacted pharmacy to see what was needed and Saniya with [facility] stated nothing was needed an the patient would get a text when it was ready.

## 2024-04-19 NOTE — Telephone Encounter (Signed)
 Patient called stating she was told by the pharmacist at [facility] that they were unable to fill her two prescriptions (Sulfasalazine  and Clobetasol ) because Dr. Jeannetta is not a medicaid provider.  Patient was told she would need to contact the office.   Patient requested a return call.

## 2024-05-22 ENCOUNTER — Ambulatory Visit: Payer: Self-pay | Admitting: Physician Assistant

## 2024-06-06 ENCOUNTER — Ambulatory Visit: Admitting: Internal Medicine
